# Patient Record
Sex: Male | Born: 1940 | Race: White | Hispanic: No | Smoking: Former smoker
Health system: Southern US, Community
[De-identification: ages and names within clinical notes are randomized; demographics above are authoritative.]

## PROBLEM LIST (undated history)

## (undated) DIAGNOSIS — B059 Measles without complication: Secondary | ICD-10-CM

## (undated) DIAGNOSIS — I739 Peripheral vascular disease, unspecified: Secondary | ICD-10-CM

## (undated) DIAGNOSIS — I639 Cerebral infarction, unspecified: Secondary | ICD-10-CM

## (undated) DIAGNOSIS — R7881 Bacteremia: Secondary | ICD-10-CM

## (undated) DIAGNOSIS — Z87440 Personal history of urinary (tract) infections: Secondary | ICD-10-CM

## (undated) DIAGNOSIS — N400 Enlarged prostate without lower urinary tract symptoms: Secondary | ICD-10-CM

## (undated) DIAGNOSIS — F0390 Unspecified dementia without behavioral disturbance: Secondary | ICD-10-CM

## (undated) DIAGNOSIS — R1310 Dysphagia, unspecified: Secondary | ICD-10-CM

## (undated) DIAGNOSIS — J449 Chronic obstructive pulmonary disease, unspecified: Secondary | ICD-10-CM

## (undated) DIAGNOSIS — D72820 Lymphocytosis (symptomatic): Secondary | ICD-10-CM

## (undated) DIAGNOSIS — I10 Essential (primary) hypertension: Secondary | ICD-10-CM

## (undated) DIAGNOSIS — B269 Mumps without complication: Secondary | ICD-10-CM

## (undated) DIAGNOSIS — E669 Obesity, unspecified: Secondary | ICD-10-CM

## (undated) DIAGNOSIS — E785 Hyperlipidemia, unspecified: Secondary | ICD-10-CM

## (undated) DIAGNOSIS — F419 Anxiety disorder, unspecified: Secondary | ICD-10-CM

## (undated) DIAGNOSIS — F633 Trichotillomania: Secondary | ICD-10-CM

## (undated) DIAGNOSIS — B019 Varicella without complication: Secondary | ICD-10-CM

## (undated) HISTORY — DX: Peripheral vascular disease, unspecified: I73.9

## (undated) HISTORY — DX: Cerebral infarction, unspecified: I63.9

## (undated) HISTORY — DX: Mumps without complication: B26.9

## (undated) HISTORY — DX: Chronic obstructive pulmonary disease, unspecified: J44.9

## (undated) HISTORY — PX: OTHER SURGICAL HISTORY: SHX169

## (undated) HISTORY — DX: Measles without complication: B05.9

## (undated) HISTORY — DX: Varicella without complication: B01.9

## (undated) HISTORY — DX: Unspecified dementia, unspecified severity, without behavioral disturbance, psychotic disturbance, mood disturbance, and anxiety: F03.90

## (undated) HISTORY — DX: Essential (primary) hypertension: I10

## (undated) HISTORY — DX: Bacteremia: R78.81

## (undated) HISTORY — DX: Hyperlipidemia, unspecified: E78.5

## (undated) HISTORY — DX: Benign prostatic hyperplasia without lower urinary tract symptoms: N40.0

## (undated) HISTORY — DX: Personal history of urinary (tract) infections: Z87.440

## (undated) HISTORY — DX: Obesity, unspecified: E66.9

---
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
## 1977-09-12 HISTORY — PX: BACK SURGERY: SHX140

## 1994-09-12 DIAGNOSIS — I639 Cerebral infarction, unspecified: Secondary | ICD-10-CM

## 1994-09-12 HISTORY — DX: Cerebral infarction, unspecified: I63.9

## 1997-09-12 HISTORY — PX: CARPAL TUNNEL RELEASE: SHX101

## 2000-08-05 ENCOUNTER — Encounter: Payer: Self-pay | Admitting: Cardiology

## 2000-08-05 ENCOUNTER — Encounter (INDEPENDENT_AMBULATORY_CARE_PROVIDER_SITE_OTHER): Payer: Self-pay | Admitting: *Deleted

## 2000-08-05 ENCOUNTER — Inpatient Hospital Stay (HOSPITAL_COMMUNITY): Admission: EM | Admit: 2000-08-05 | Discharge: 2000-08-07 | Payer: Self-pay | Admitting: Emergency Medicine

## 2000-08-07 ENCOUNTER — Encounter: Payer: Self-pay | Admitting: Cardiology

## 2000-12-05 ENCOUNTER — Encounter: Payer: Self-pay | Admitting: Emergency Medicine

## 2000-12-06 ENCOUNTER — Inpatient Hospital Stay (HOSPITAL_COMMUNITY): Admission: EM | Admit: 2000-12-06 | Discharge: 2000-12-07 | Payer: Self-pay | Admitting: Emergency Medicine

## 2000-12-06 ENCOUNTER — Encounter: Payer: Self-pay | Admitting: Neurology

## 2002-03-04 ENCOUNTER — Encounter: Payer: Self-pay | Admitting: Family Medicine

## 2002-03-04 ENCOUNTER — Encounter: Admission: RE | Admit: 2002-03-04 | Discharge: 2002-03-04 | Payer: Self-pay | Admitting: Family Medicine

## 2002-03-04 ENCOUNTER — Encounter: Payer: Self-pay | Admitting: Orthopedic Surgery

## 2002-03-14 ENCOUNTER — Ambulatory Visit (HOSPITAL_BASED_OUTPATIENT_CLINIC_OR_DEPARTMENT_OTHER): Admission: RE | Admit: 2002-03-14 | Discharge: 2002-03-15 | Payer: Self-pay | Admitting: Orthopedic Surgery

## 2002-08-15 ENCOUNTER — Ambulatory Visit (HOSPITAL_BASED_OUTPATIENT_CLINIC_OR_DEPARTMENT_OTHER): Admission: RE | Admit: 2002-08-15 | Discharge: 2002-08-15 | Payer: Self-pay | Admitting: Orthopedic Surgery

## 2003-09-13 HISTORY — PX: HERNIA REPAIR: SHX51

## 2003-12-31 ENCOUNTER — Encounter: Admission: RE | Admit: 2003-12-31 | Discharge: 2003-12-31 | Payer: Self-pay | Admitting: Family Medicine

## 2004-01-12 ENCOUNTER — Encounter: Admission: RE | Admit: 2004-01-12 | Discharge: 2004-01-12 | Payer: Self-pay | Admitting: Family Medicine

## 2004-05-28 ENCOUNTER — Ambulatory Visit (HOSPITAL_COMMUNITY): Admission: RE | Admit: 2004-05-28 | Discharge: 2004-05-28 | Payer: Self-pay | Admitting: Orthopedic Surgery

## 2004-06-23 ENCOUNTER — Ambulatory Visit (HOSPITAL_COMMUNITY): Admission: RE | Admit: 2004-06-23 | Discharge: 2004-06-24 | Payer: Self-pay | Admitting: Neurological Surgery

## 2004-07-12 ENCOUNTER — Encounter: Admission: RE | Admit: 2004-07-12 | Discharge: 2004-07-12 | Payer: Self-pay | Admitting: General Surgery

## 2004-08-19 ENCOUNTER — Inpatient Hospital Stay (HOSPITAL_COMMUNITY): Admission: RE | Admit: 2004-08-19 | Discharge: 2004-08-24 | Payer: Self-pay | Admitting: General Surgery

## 2005-01-17 ENCOUNTER — Encounter: Admission: RE | Admit: 2005-01-17 | Discharge: 2005-01-17 | Payer: Self-pay | Admitting: General Surgery

## 2005-03-11 ENCOUNTER — Ambulatory Visit (HOSPITAL_COMMUNITY): Admission: RE | Admit: 2005-03-11 | Discharge: 2005-03-11 | Payer: Self-pay | Admitting: Orthopedic Surgery

## 2005-06-26 ENCOUNTER — Emergency Department (HOSPITAL_COMMUNITY): Admission: EM | Admit: 2005-06-26 | Discharge: 2005-06-26 | Payer: Self-pay | Admitting: Emergency Medicine

## 2006-10-13 LAB — HM DIABETES EYE EXAM

## 2006-10-13 LAB — HM COLONOSCOPY

## 2007-05-30 ENCOUNTER — Ambulatory Visit: Payer: Self-pay | Admitting: Cardiology

## 2009-04-03 ENCOUNTER — Ambulatory Visit (HOSPITAL_COMMUNITY)
Admission: RE | Admit: 2009-04-03 | Discharge: 2009-04-03 | Payer: Self-pay | Admitting: Physical Medicine and Rehabilitation

## 2009-05-09 DIAGNOSIS — I10 Essential (primary) hypertension: Secondary | ICD-10-CM | POA: Insufficient documentation

## 2009-05-09 DIAGNOSIS — E782 Mixed hyperlipidemia: Secondary | ICD-10-CM

## 2009-05-19 ENCOUNTER — Ambulatory Visit: Payer: Self-pay | Admitting: Cardiology

## 2009-05-19 DIAGNOSIS — I739 Peripheral vascular disease, unspecified: Secondary | ICD-10-CM

## 2009-05-19 DIAGNOSIS — E669 Obesity, unspecified: Secondary | ICD-10-CM | POA: Insufficient documentation

## 2009-05-28 ENCOUNTER — Telehealth (INDEPENDENT_AMBULATORY_CARE_PROVIDER_SITE_OTHER): Payer: Self-pay

## 2009-06-01 ENCOUNTER — Ambulatory Visit: Payer: Self-pay

## 2009-06-01 ENCOUNTER — Encounter: Payer: Self-pay | Admitting: Cardiology

## 2009-06-09 ENCOUNTER — Telehealth: Payer: Self-pay | Admitting: Cardiology

## 2009-07-02 ENCOUNTER — Ambulatory Visit (HOSPITAL_COMMUNITY): Admission: RE | Admit: 2009-07-02 | Discharge: 2009-07-02 | Payer: Self-pay | Admitting: Orthopedic Surgery

## 2009-10-23 ENCOUNTER — Telehealth (INDEPENDENT_AMBULATORY_CARE_PROVIDER_SITE_OTHER): Payer: Self-pay | Admitting: *Deleted

## 2009-10-28 ENCOUNTER — Ambulatory Visit: Admission: RE | Admit: 2009-10-28 | Discharge: 2009-10-28 | Payer: Self-pay | Admitting: Orthopedic Surgery

## 2010-04-02 ENCOUNTER — Encounter: Admission: RE | Admit: 2010-04-02 | Discharge: 2010-04-02 | Payer: Self-pay | Admitting: Neurology

## 2010-05-24 ENCOUNTER — Encounter: Admission: RE | Admit: 2010-05-24 | Discharge: 2010-05-24 | Payer: Self-pay | Admitting: Neurology

## 2010-10-03 ENCOUNTER — Encounter: Payer: Self-pay | Admitting: Family Medicine

## 2010-10-04 ENCOUNTER — Encounter: Payer: Self-pay | Admitting: Physical Medicine and Rehabilitation

## 2010-10-14 NOTE — Progress Notes (Signed)
  Phone Note From Other Clinic   Caller: jenny/SS Details for Reason: Pt.information Initial call taken by: Deitra Mayo over to [phone] [facility]  October 23, 2009 12:43 PM

## 2010-12-01 LAB — BASIC METABOLIC PANEL
BUN: 11 mg/dL (ref 6–23)
CO2: 27 mEq/L (ref 19–32)
Calcium: 9.6 mg/dL (ref 8.4–10.5)
Chloride: 98 mEq/L (ref 96–112)
Creatinine, Ser: 0.98 mg/dL (ref 0.4–1.5)
GFR calc Af Amer: >60 mL/min (ref >60)
GFR calc non Af Amer: >60 mL/min (ref >60)
Glucose, Bld: 107 mg/dL — ABNORMAL HIGH (ref 70–99)
Potassium: 4.4 mEq/L (ref 3.5–5.1)
Sodium: 133 mEq/L — ABNORMAL LOW (ref 135–145)

## 2010-12-01 LAB — CBC
MCHC: 33.8 g/dL (ref 30.0–36.0)
MCV: 99.2 fL (ref 78.0–100.0)
Platelets: 354 10*3/uL (ref 150–400)
RBC: 4.78 MIL/uL (ref 4.22–5.81)
WBC: 10.1 10*3/uL (ref 4.0–10.5)

## 2010-12-01 LAB — ABO/RH: ABO/RH(D): A POS

## 2010-12-01 LAB — TYPE AND SCREEN
ABO/RH(D): A POS
Antibody Screen: NEGATIVE

## 2010-12-15 ENCOUNTER — Encounter: Payer: Self-pay | Admitting: Family Medicine

## 2010-12-15 DIAGNOSIS — I739 Peripheral vascular disease, unspecified: Secondary | ICD-10-CM | POA: Insufficient documentation

## 2010-12-15 DIAGNOSIS — J449 Chronic obstructive pulmonary disease, unspecified: Secondary | ICD-10-CM

## 2010-12-16 LAB — BASIC METABOLIC PANEL
BUN: 7 mg/dL (ref 6–23)
Calcium: 9.3 mg/dL (ref 8.4–10.5)
GFR calc non Af Amer: >60 mL/min (ref >60)
Glucose, Bld: 104 mg/dL — ABNORMAL HIGH (ref 70–99)
Potassium: 4.5 mEq/L (ref 3.5–5.1)

## 2010-12-16 LAB — CBC
HCT: 47.3 % (ref 39.0–52.0)
Platelets: 360 10*3/uL (ref 150–400)
RDW: 14.5 % (ref 11.5–15.5)
WBC: 11.1 10*3/uL — ABNORMAL HIGH (ref 4.0–10.5)

## 2010-12-19 LAB — CBC
HCT: 47.6 % (ref 39.0–52.0)
Hemoglobin: 16 g/dL (ref 13.0–17.0)
MCV: 99.4 fL (ref 78.0–100.0)
Platelets: 342 10*3/uL (ref 150–400)
RBC: 4.78 MIL/uL (ref 4.22–5.81)
WBC: 10.4 10*3/uL (ref 4.0–10.5)

## 2010-12-19 LAB — BASIC METABOLIC PANEL
Chloride: 103 mEq/L (ref 96–112)
GFR calc Af Amer: >60 mL/min (ref >60)
GFR calc non Af Amer: >60 mL/min (ref >60)
Potassium: 4.5 mEq/L (ref 3.5–5.1)
Sodium: 138 mEq/L (ref 135–145)

## 2010-12-20 ENCOUNTER — Encounter: Payer: Self-pay | Admitting: Family Medicine
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
## 2011-01-28 NOTE — Discharge Summary (Signed)
NAMETRUITT, CRUEY NO.:  [id number]   MEDICAL RECORD NO.:  [id number]          PATIENT TYPE:  INP   LOCATION:  0449                         FACILITY:  [facility]   PHYSICIAN:  Ollen Gross. Vernell Morgans, M.D. DATE OF BIRTH:  December 08, 1940   DATE OF ADMISSION:  08/19/2004  DATE OF DISCHARGE:  08/24/2004                                 DISCHARGE SUMMARY   Mr. Jonathan Wells is a 70 year old white male who had a ventral hernia.  He was  brought to the operating room on December 8 for a laparoscopic ventral  hernia repair with mesh.  His operation went very well.  Postoperatively, he  was having significant problems with pain control and on postoperative day  #1, had to be started on a PCA pump.  It was difficult for him to move  around because of his pain level.  He was continued with his PCA pump for a  couple of days and his diet was advanced.  On postoperative day #3, he was  noted to have some lower extremities weakness and there was some question as  to whether he could have developed a DVT.  He was started on Lovenox.  Duplex was ordered which was not done until December 12 but the Duplex did  not show any evidence of DVT.  At that point, his Lovenox was stopped.  He  was doing better on oral pain control medicines and on December 13 he was  ready for discharge home.   DISCHARGE MEDICATIONS:  He was to resume his home medications.  He was given  a prescription for Vicodin for pain.   DISCHARGE ACTIVITIES:  No heavy lifting.   FINAL DIAGNOSIS:  Ventral hernia.   CONDITION ON DISCHARGE:  Stable.   DIET:  As tolerated.   FOLLOW UP:  Will be with Dr. Carolynne Edouard in two weeks and he is discharged home.      PST/MEDQ  D:  09/28/2004  T:  09/28/2004  Job:  [id number]

## 2011-01-28 NOTE — Op Note (Signed)
Jonathan Wells, Jonathan Wells NO.:  [id number]   MEDICAL RECORD NO.:  [id number]          PATIENT TYPE:  INP   LOCATION:  0449                         FACILITY:  [facility]   PHYSICIAN:  Ollen Gross. Vernell Morgans, M.D. DATE OF BIRTH:  04/28/1941   DATE OF PROCEDURE:  08/19/2004  DATE OF DISCHARGE:                                 OPERATIVE REPORT   PREOPERATIVE DIAGNOSES:  Ventral hernia.   POSTOPERATIVE DIAGNOSES:  Ventral hernia.   OPERATION PERFORMED:  Laparoscopic ventral hernia repair with mesh.   SURGEON:  Ollen Gross. Carolynne Edouard, M.D.   ASSISTANT:  Leonie Man, M.D. t   ANESTHESIA:  General endotracheal.   DESCRIPTION OF PROCEDURE:  After informed consent was obtained, the patient  was brought to the operating room and placed in supine position on the  operating table.  After adequate induction of general anesthesia, the  patient's abdomen was prepped with Betadine and draped in the usual sterile  manner including an Ioban drape.  Initially, a small incision was made in  the right upper quadrant with a 15 blade knife and OptiView type port was  then used with a camera to bluntly dissect through all of the abdominal wall  layers bluntly until access was gained to the abdominal cavity.  The abdomen  was then insufflated with carbon dioxide without difficulty.  The  laparoscope was placed through the operative port and the abdominal wall was  examined.  There were a lot of adhesions along the midline.  These were  taken down by a combination of sharp dissection with the Harmonics scalpel  and blunt traction.  This was done through another 5 mm port in the right  lateral abdominal wall.  This port was made with a stab incision with a 15  blade knife and then a 5 mm port was placed bluntly through this incision  into the abdominal cavity under direct vision.  This was very tedious work  and took some time.  Once all of these adhesions were taken down from the  anterior abdominal wall  and care was taken to make sure that the bowel was  away from all of this dissection, then the abdominal wall was examined.  It  had several smaller hernia type defects in the fascial layer.  Because of  this, a very large piece of mesh was used that would cover the entire area.  The size of the defects was estimated using a spinal needle and a large  piece of Proseed mesh was used and cut to the appropriate size. The mesh was  oriented with the blue side up and marked to correspond to markings on the  abdominal wall for orientation.  #1 Novofils were then placed in eight  equidistant areas around the edge of the mesh.  The mesh was then rolled  like a cigar and placed through the OptiView port into the abdominal cavity  without difficulty.  The mesh was then unrolled and oriented appropriately  to the markings that were made.  Small stab incisions were made in eight  places around  the hernia defect corresponding to the appropriate Novofil  stitch and a suture passer device was used to puncture through the abdominal  wall and grab each of the tails of the appropriate Novofil stitch and bring  them through the abdominal wall.  As this was done each of the pair of tails  of the Novofil stitches was grasped with hemostats.  Once this had been done  in all eight places, the mesh was pulled up by the Novofil stitches to  approximately to the abdominal wall.  The mesh was examined and nicely  approximated to the abdominal wall without any folds or wrinkles.  Each of  the Novofils was then tied down. The areas in between the Novofil stitches  was approximated to the abdominal wall using tacks so that there were no  gaps in the mesh.  Once this was accomplished, the entire area was examined  and found to be hemostatic.  The ports were then removed under direct  vision.  The gas was allowed to escape and the mesh was observed to continue  to nicely approximate to the abdominal wall as the gas was  released.  The  incisions then were all closed with interrupted 4-0 Monocryl subcuticular  stitches.  Benzoin and Steri-Strips and sterile dressings were applied.  The  patient tolerated the procedure well.  At the end of the case all sponge,  needle and instrument counts were correct.  The patient was then awakened  and taken to the recovery room in stable condition.     [name]   PST/MEDQ  D:  08/23/2004  T:  08/23/2004  Job:  [id number]

## 2011-01-28 NOTE — H&P (Signed)
[place]  Patient:    Jonathan Wells                           MRN: [id number] Adm. Date:  [id number] Attending:  Fenton Malling                         History and Physical  DATE OF BIRTH:  [birth year].  CHIEF COMPLAINT:  Left-sided numbness and possible weakness.  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 The symptoms have improved somewhat since his arrival.  He states that the arm suddenly became tingly and felt numb.  He claims that it was weak but there is no objective evidence that it actually was.  He denies any involvement of the left leg or face, any changes in his vision or speech or any symptoms on the right.  He says that these symptoms were similar to those which preceded his prior stroke in 1992.  He denies any associated chest pain or palpitations. He has had some shortness of breath but ascribes this to a recent upper respiratory tract infection which has been going on for a few days.  PAST MEDICAL HISTORY:  Past medical history is remarkable for hypertension and hyperlipidemia, both of which are reportedly well-controlled.  He reportedly had a previous stroke resulting in left hemiparesis in 1992.  He saw ______ in [facility] for this.  He also reports that the workup revealed a cerebral aneurysm.  He says that this ruptured but on further questioning, it is clear that he did not undergo any kind of surgery.  He was admitted in November of last year for abdominal pain likely related to esophagitis.  FAMILY MEDICAL HISTORY:  Remarkable for hypertension and diabetes.  No family history of  stroke.  SOCIAL HISTORY:  He lives in [place] and is unemployed.  He has not smoked in about four years.  He occasionally consumes alcohol.  ALLERGIES:  No known drug allergies.  MEDICATIONS: 1. Baby aspirin q.d. 2. Plavix 75 mg q.d. 3. Zocor unknown dose q.d. 4. Accupril 20 mg q.d.  REVIEW OF SYSTEMS:  CONSTITUTIONAL:  No fever or chills.  HEAD:  Mild headache.  EYES:  Negative.  ENT:  URI symptoms x 5 days.  RESPIRATORY: Shortness of breath and cough.  CV:  No chest pain or palpitations.  GI: Constipation x 4 days.  GU:  Negative.  HEMATOLOGIC:  Negative.  PSYCHIATRIC: Negative.  PHYSICAL EXAMINATION:  VITAL SIGNS:  Temperature 98.4, blood pressure 128/71, pulse 80, respirations 24.  GENERAL/MENTAL STATUS:  He is awake, alert, fully oriented and in no evident distress.  His speech is normal.  HEAD:  Cranium is normocephalic and atraumatic.  Oropharynx is benign.  NECK:  Supple without bruit.  CHEST:  Expiratory wheezes.  HEART:  Regular rate and rhythm, without murmurs.  ABDOMEN:  Obese, soft and nondistended.  EXTREMITIES:  Without edema.  NEUROLOGIC:  Mental status as above.  Cranial nerves:  Funduscopic exam is benign.  Pupils are equal and briskly reactive.  The extraocular movements are normal without nystagmus.  Visual fields  are full to confrontation.  Face, tongue and palate all move normally.  Motor exam:  Normal bulk and tone.  No atrophy or fasciculations.  Normal strength in all tested extremity muscles. Sensation:  Decreased pinprick in the fingertips of the left hand.  Light touch and vibration are intact.  Cerebellar:  Rapid alternating movements are normal.  Finger-to-nose is performed well.  Gait is normal and he is able to heel, toe and tandem walk.  Reflexes are trace throughout.  Toes are downgoing.  LABORATORY DATA:  CBC:  WBC 14.4, hemoglobin 14.5, platelets 419,000. Coagulations are normal.  Electrolytes are pending.  EKG reveals  occasional PVCs but no acute findings.  CT of the head reveals no acute findings, with a questionable aneurysm in the area of the right MCA trifurcation.  IMPRESSION: 1. Right brain transient ischemic attack. 2. 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  PLAN: 1. MRI/MRA. 2. Carotid Dopplers. 3. Echocardiogram. 4. Continue aspirin and Plavix for now. 5. Disposition pending above. DD:  12/06/00 TD:  12/06/00 Job: [id number]

## 2011-01-28 NOTE — Op Note (Signed)
[place]. [facility]  Patient:    Jonathan Wells, Jonathan Wells Visit Number: [id number] MRN: [id number]          Service Type: DSU Location: [facility] Attending Physician:  Cornell Barman Dictated by:   Lenard Galloway Chaney Malling, M.D. Proc. Date: 03/14/02 Admit Date:  03/14/2002 Discharge Date: 03/14/2002                             Operative Report  PREOPERATIVE DIAGNOSIS:  Rotator cuff tear, right shoulder.  POSTOPERATIVE DIAGNOSIS:  Retractor tear supraspinatus, right shoulder.  OPERATION: 1. Diagnostic arthroscopy 2. Neer anterior one-third acromioplasty and open repair retracted    supraspinatus tendon using two Mitek sutures.  SURGEON:  Lenard Galloway. Chaney Malling, M.D.  ANESTHESIA:  General.  DESCRIPTION OF THE PROCEDURE:  After satisfactory endotracheal anesthesia the patient was placed on the operating table in a semi-seated position. The right shoulder and upper extremity was prepped with Duraprep and draped out in the usual manner. Through the posterior standard portal, the scope was introduced into the glenohumeral joint.  The articular cartilage of the humeral head and the glenoid was absolutely normal, as was the entire circumference of the labrum. The biceps attached to the labral area superiorly and there no tears and no abnormalities. There was fraying of the supraspinatus tendon which could clearly be seen. There was a hole in the supraspinatus as it exited into the subacromial space. A subacromial view was then obtained. There was a retracted tear of the supraspinatus and a probe could be placed easily through this tear.  A saber cut incision was made over the anterior aspect of the right shoulder. The skin edges were retracted. The bleeders were coagulated. The deltoid fibers were released on the anterior aspect of the acromion only. A very generous Neer anterior one-third acromioplasty was then done. This gave an excellent access to the  shoulder.  The subdeltoid bursa was excised. There was a very large tear and this was easily seen. The edges could be brought back down to an anatomic position. A rongeur was used to debride the bone so as good bleeding bone in the area of the repair. Two Mitek sutures were inserted into the bone with their anchors and suture brought through the rotator cuff and it was pulled down to an anatomic position and a water tight repair was achieved. Excellent stability of the repair was achieved. Throughout the procedure the shoulder was irrigated with copious amounts of antibiotic solution.  The deltoid fibers were then reattached using #1 Vicryl. Then 2-0 Vicryl was used to close the subcutaneous tissue and standard sterile tape was used to close the skin. Marcaine was placed in the shoulder and sterile dressings were applied.  The patient was then returned to the recovery room in excellent condition. He tolerated the procedure extremely well. There were no complications. No drains were placed. Dictated by:   Lenard Galloway Chaney Malling, M.D. Attending Physician:  Cornell Barman DD:  03/14/02 TD:  03/18/02 Job: [id number]

## 2011-01-28 NOTE — Op Note (Signed)
NAMEELRIC, TIRADO                              ACCOUNT NO.:  [id number]   MEDICAL RECORD NO.:  [id number]                   PATIENT TYPE:  AMB   LOCATION:  DSC                                  FACILITY:  [facility]   PHYSICIAN:  Rodney A. Chaney Malling, M.D.           DATE OF BIRTH:  17-Dec-1940   DATE OF PROCEDURE:  08/15/2002  DATE OF DISCHARGE:                                 OPERATIVE REPORT   PREOPERATIVE DIAGNOSIS:  Tear to medial meniscus, left knee.   POSTOPERATIVE DIAGNOSIS:  Tear to medial meniscus, left knee.   PROCEDURE:  Subtotal medial meniscectomy for large bucket handle tear,  medial meniscus of left knee.   SURGEON:  Lenard Galloway. Chaney Malling, M.D.   ANESTHESIA:  MAC.   DRAINS:  None.   COMPLICATIONS:  None.   PATHOLOGY:  With the arthroscope in the knee, a very careful examination of  both compartments was undertaken.  The patellofemoral joint appeared fairly  normal.  There was normal articular cartilage in the femoral notch area.  The arthroscope was passed into the lateral compartment, and the articular  cartilage of the lateral femoral condyle, the lateral tibial plateau, and  the _______ of the lateral meniscus was normal.  The arthroscope was then  passed into the intercondylar notch.  The anterior cruciate ligament was  essentially normal.  In the medial compartment, the articular cartilage of  the medial femoral condyle appeared fairly normal.  There was some slight  scuffing of the articular cartilage of the tibial plateau.  However, there  was a very large bucket handle tear of the medial meniscus, which was  detached posteriorly and left attached anteriorly.  The posterior two-thirds  of the medial meniscus was detached.   DESCRIPTION OF PROCEDURE:  The patient was placed on the operating table in  a supine position with the pneumatic tourniquet about the left upper thigh.  The left leg was placed in a leg holder and the entire left lower extremity  prepped  with Duraprep and draped out in the usual manner.  Marcaine had been  placed in the knee and Xylocaine and epinephrine used to infiltrate the  puncture wounds.  An infusion cannula was placed in the superior medial  pouch and the knee distended with saline.  The only pathology seen in the  medial meniscus was a very large bucket handle tear of the medial meniscus,  and this was still attached anteriorly.  Through the lateral portal, a  triangular retrograde knife was inserted and the anterior attachment of the  bucket handle tear was incised.  A pituitary was then inserted there, and  this large flap of meniscus was completely removed as a large entity.  A  series of baskets was then inserted, and remaining remnant was then  debrided.  The intra-articular shaver was introduced with smoothing of this  rim with nice transition of the anterior one-third  of the medial meniscus,  which appeared fairly normal.  Posteriorly there were remnants, which was  folded back on itself up behind the medial femoral condyle.  This was pulled  down with a probe and debrided with baskets, followed by the intra-articular  shaver.  Complete decompression of the posterior two-thirds of the meniscus was  achieved very nicely.  Marcaine was then placed in the knee and a large  bulky compressive dressing applied.  The patient returned to the recovery  room in excellent condition.  Technically this procedure went extremely  well.                                               Rodney A. Chaney Malling, M.D.    RAM/MEDQ  D:  08/15/2002  T:  08/15/2002  Job:  [id number]

## 2011-01-28 NOTE — Op Note (Signed)
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   PROCEDURE:  Redo left L4-5 hemilaminotomy, medial facetectomy, and  foraminotomy followed by microdiskectomy at L4-5 on the left utilizing  microscopic dissection.   SURGEON:  Tia Alert, M.D.   ASSISTANT:  Donalee Citrin, M.D.   ANESTHESIA:  General endotracheal.   COMPLICATIONS:  None apparent.   INDICATIONS FOR PROCEDURE:  The patient is a 70 year old male who had  undergone a previous left L4-5 microdiskectomy by another physician.  He had  a return of his left L5 radicular pain and had an MRI which showed a large  recurrent disk herniation at L4-5 on the left.  I recommended a repeat  microdiskectomy at L4-5 on the left.  He understood the risks, benefits, and  alternatives and wished to proceed.   DESCRIPTION OF PROCEDURE:  The patient was taken to the operating room where  after induction of adequate general endotracheal anesthesia, he was rolled  in the prone position on the Wilson frame with all pressure points padded.  His lumbar region was prepped with Duraprep and then draped in the usual  sterile fashion.  5 mL of local anesthesia was injected and then a dorsal  midline incision was made and carried down to the lumbosacral fascia.  The  fascia was opened and the paraspinous musculature was taken down in a  subperiosteal fashion to expose the L4-5 interspace on the left side.  Intraoperative x-ray confirmed our level and then the previous scar was  removed from over  the dura and we were able to widen the laminotomy, the  medial facetectomy, and the foraminotomy with the Kerrison punch to expose  the normal dura.  The operating microscope was brought into the field and  the dura was retracted medially and the epidural fibrosis was dissected to  detether the nerve root and then it was retracted medially and the disk  space was incised with a 15 blade scalpel and a thorough intradiskal  diskectomy was performed with pituitary rongeurs.  Once the diskectomy was  complete, we palpated with nerve hooks to assure no more compressive  lesions.  The nerve root was free and pulsatile.  We irrigated with saline  solution containing bacitracin and then closed the fascia with interrupted  #1 Vicryl.  We closed the subcutaneous and subcuticular tissue  with 2-0 and 3-0 Vicryl.  We closed the skin with Dermabond.  The drapes  were removed and a sterile dressing was applied.  The patient was awakened  from general anesthesia and transported to the recovery room in stable  condition.  At the end of the procedure, all needle, sponge, and instrument  counts correct.       DSJ/MEDQ  D:  06/23/2004  T:  06/24/2004  Job:  [id number]

## 2011-01-28 NOTE — Discharge Summary (Signed)
[facility]  Patient:    Jonathan Wells, Jonathan Wells                           MRN: [id number] Adm. Date:  [id number] Disc. Date: [id number] Attending:  Mirian Wells Dictator:   Jonathan Wells, P.A. CC:         Jonathan Wells. Jonathan Wells, M.D.  Jonathan Wells. Jonathan Wells, M.D. Jonathan Wells  Jonathan Wells, M.D.   Discharge Summary  PROCEDURES:  1. An adenosine Cardiolite study was performed on August 07, 2000.  2. Esophagogastroduodenoscopy was performed on August 07, 2000.  HISTORY OF PRESENT ILLNESS: Mr. Jonathan Wells is a 70 year old male, without prior history of heart disease, but with multiple cardiac risk factors including peripheral vascular disease (status post aortobifemoral procedure in 1995), a history of TIA, hyperlipidemia, and hypertension, who presented with a one week history of right upper quadrant and epigastric discomfort.  He reported exacerbation of this discomfort with exertion and following meals. Additionally, he reported episodes of diaphoresis and exertional dyspnea.  He was referred from his primary care physician, Dr. Andrey Wells, to the emergency room and was admitted for rule out of MI and further diagnostic evaluation.  LABORATORY DATA: Cardiac enzymes were obtained and CPK-MB was negative x 4. Troponin I was less than 0.01 x 2.  Amylase and lipase were normal.  A mildly decreased albumin was noted at 3.2 with marginally elevated AST of 40; otherwise, normal LFTs.  Sodium was 131, potassium 4.5, glucose 97, BUN 14, creatinine 0.8 on admission.  INR was 0.8.  WBC was 9.9, hemoglobin 14.9, hematocrit 40.4, platelets 446,000 on admission.  Abdominal ultrasound showed an unremarkable gallbladder.  Wells COURSE: The patient ruled out for MI with negative serial cardiac enzymes.  CT scans of the chest and abdomen revealed no evidence of aortic dissection. There was a question of focal pancreatitis of the head of the pancreas and this was reviewed by Dr. Daphine Wells.   Follow-up amylase and lipase levels were recommended and these were negative.  Cardiac work-up consisted of pharmacologic stress testing.  The patient did develop some hypotension during the procedure in the absence of any EKG abnormalities or chest discomfort.  He had systolic blood pressure lowering to the mid 80 range, which responded to saline boluses and infusion drip.  Blood pressure was stabilized and the patient proceeded to undergo esophagogastroduodenoscopy, which had been recommended by gastroenterology as part of their evaluation.  This study revealed evidence of esophagitis and duodenitis, with recommendation to treat with b.i.d. dosing of Protonix.  The Cardiolite scan revealed no significant abnormalities, with no evidence of redistribution, and question of inferior wall attenuation.  Calculated ejection fraction was 54%.  No further cardiac work-up was recommended.  DISCHARGE MEDICATIONS:  1. Protonix 40 mg b.i.d.  2. Accupril 20 mg q.d.  3. Zocor as previously directed.  4. Plavix 75 mg q.d.  5. Aspirin 81 mg q.d.  FOLLOW-UP: The patient is instructed to schedule a follow-up appointment with Jonathan Wells in the following one to two weeks.  DISCHARGE DIAGNOSES:  1. Esophagitis/duodenitis.     a. Esophagogastroduodenoscopy on August 07, 2000.  2. Multiple cardiac risk factors.     a. Normal adenosine Cardiolite study showing ejection fraction of 54% on        August 07, 2000.  3. Status post hypotension.  4. Peripheral vascular disease.     a. Status post aortobifemoral graft in 1995.  5.  History of transient ischemic attack. DD:  08/07/00 TD:  08/07/00 Job: [id number]

## 2011-03-24 ENCOUNTER — Inpatient Hospital Stay (HOSPITAL_COMMUNITY): Payer: Medicare Other

## 2011-03-24 ENCOUNTER — Inpatient Hospital Stay (HOSPITAL_COMMUNITY)
Admission: EM | Admit: 2011-03-24 | Discharge: 2011-03-25 | DRG: 093 | Disposition: A | Discharge status: Home / Self Care | Payer: Medicare Other | Attending: Internal Medicine | Admitting: Internal Medicine

## 2011-03-24 ENCOUNTER — Emergency Department (HOSPITAL_COMMUNITY): Payer: Medicare Other

## 2011-03-24 DIAGNOSIS — G4733 Obstructive sleep apnea (adult) (pediatric): Secondary | ICD-10-CM | POA: Diagnosis present

## 2011-03-24 DIAGNOSIS — E119 Type 2 diabetes mellitus without complications: Secondary | ICD-10-CM | POA: Diagnosis present

## 2011-03-24 DIAGNOSIS — E785 Hyperlipidemia, unspecified: Secondary | ICD-10-CM | POA: Diagnosis present

## 2011-03-24 DIAGNOSIS — R4789 Other speech disturbances: Secondary | ICD-10-CM | POA: Diagnosis present

## 2011-03-24 DIAGNOSIS — Z8673 Personal history of transient ischemic attack (TIA), and cerebral infarction without residual deficits: Secondary | ICD-10-CM

## 2011-03-24 DIAGNOSIS — R29810 Facial weakness: Principal | ICD-10-CM | POA: Diagnosis present

## 2011-03-24 DIAGNOSIS — E669 Obesity, unspecified: Secondary | ICD-10-CM | POA: Diagnosis present

## 2011-03-24 DIAGNOSIS — I739 Peripheral vascular disease, unspecified: Secondary | ICD-10-CM | POA: Diagnosis present

## 2011-03-24 DIAGNOSIS — I10 Essential (primary) hypertension: Secondary | ICD-10-CM | POA: Diagnosis present

## 2011-03-24 LAB — URINALYSIS, ROUTINE W REFLEX MICROSCOPIC
Bilirubin Urine: NEGATIVE
Glucose, UA: NEGATIVE mg/dL
Hgb urine dipstick: NEGATIVE
Specific Gravity, Urine: 1.01 (ref 1.005–1.030)

## 2011-03-24 LAB — COMPREHENSIVE METABOLIC PANEL
Albumin: 3.7 g/dL (ref 3.5–5.2)
Alkaline Phosphatase: 48 U/L (ref 39–117)
BUN: 7 mg/dL (ref 6–23)
CO2: 28 mEq/L (ref 19–32)
Chloride: 98 mEq/L (ref 96–112)
GFR calc non Af Amer: >60 mL/min (ref >60)
Potassium: 4.2 mEq/L (ref 3.5–5.1)
Total Bilirubin: 0.3 mg/dL (ref 0.3–1.2)

## 2011-03-24 LAB — CBC
HCT: 47.6 % (ref 39.0–52.0)
Hemoglobin: 16 g/dL (ref 13.0–17.0)
MCV: 95.4 fL (ref 78.0–100.0)
RBC: 4.99 MIL/uL (ref 4.22–5.81)
RDW: 15.3 % (ref 11.5–15.5)
WBC: 9.8 10*3/uL (ref 4.0–10.5)

## 2011-03-24 LAB — TROPONIN I: Troponin I: <0.3 ng/mL (ref <0.30)

## 2011-03-24 LAB — PROTIME-INR: Prothrombin Time: 12.4 seconds (ref 11.6–15.2)

## 2011-03-24 LAB — GLUCOSE, CAPILLARY: Glucose-Capillary: 137 mg/dL — ABNORMAL HIGH (ref 70–99)

## 2011-03-24 LAB — CK TOTAL AND CKMB (NOT AT ARMC)
CK, MB: 2 ng/mL (ref 0.3–4.0)
Total CK: 77 U/L (ref 7–232)

## 2011-03-24 NOTE — H&P (Signed)
Jonathan Wells, SKY NO.:  [id number]  MEDICAL RECORD NO.:  [id number]  LOCATION:  WLED                         FACILITY:  [facility]  PHYSICIAN:  Andreas Blower, MD       DATE OF BIRTH:  11-06-1940  DATE OF ADMISSION:  03/24/2011 DATE OF DISCHARGE:                             HISTORY & PHYSICAL   PRIMARY CARE PHYSICIAN:  Newman Nip, M.D.  CHIEF COMPLAINT:  Right facial droop and slurred speech.  HISTORY OF PRESENT ILLNESS:  Jonathan Wells is a 70 year old Caucasian male with history of TIA, hypertension, type 2 diabetes, peripheral vascular disease, questionable history of CVA in 1992, obesity, obstructive sleep apnea, who presents with the above complaints.  He reported that about a week ago on March 16, 2011, he noted a friend commented that his right side of his face was more droopy and had slurred speech.  Subsequently since then, the patient did not make much of it until his family noticed that his speech was more slurred and the patient had a right facial droop.  As a result, he presented to the ER for further evaluation.  The patient reports that other than right facial droop and slurred speech, he has not noticed any other symptoms.  He has not noticed any weakness in his hands or legs.  He has not noticed any change in his gait.  He has not had any recent fevers or chills.  Denies any nausea, vomiting. Denies any chest pain, shortness of breath.  Denies any abdominal pain, diarrhea, headaches, or vision changes.  Head CT in the ER did not show any acute changes.  As a result, the hospitalist service was asked to admit the patient for further workup of possible stroke.  REVIEW OF SYSTEMS:  All systems were reviewed with the patient was positive as per HPI, otherwise all other systems were negative.  PAST MEDICAL HISTORY: 1. Hypertension. 2. Hyperlipidemia. 3. Type 2 diabetes. 4. Obesity. 5. History of obstructive sleep apnea, does not use a CPAP as he    reports that he is claustrophobic. 6. History of TIA. 7. 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  This     diagnosis is questionable as he reports that it may have been due     to cerebral aneurysm. 8. History of peripheral vascular disease. 9. History of rotator cuff tear status post surgery in July 2003. 10.History of left knee meniscal tear status post surgery in December     2003. 11.History of recurrent lumbar disk herniation at L4-L5, status post     redo in October 2005. 12.History of ventral hernia repair in December 2005.  SOCIAL HISTORY:  The patient quit smoking in 97.  Drinks beer once or twice a week.  Denies any illegal drugs or substances.  FAMILY HISTORY:  Significant for diabetes.  Mother had COPD.  Father had cancer, possibly throat cancer.  HOME MEDICATIONS:  To be accurately reconciled by pharmacy.  The patient is on: 1. Lisinopril 5 mg p.o. daily 2. Metformin 1000 mg p.o. twice daily 3. Trilipix 135 mg p.o. daily. 4. Simvastatin 40 mg p.o. daily 5. Nortriptyline 25 mg  p.o. daily. 6. Vitamin D3 5000 mg IU p.o. daily.  PHYSICAL EXAMINATION:  VITAL SIGNS:  Temperature 97.6, blood pressure 122/72, heart rate 85, respiration 20, satting at 95% on room air. GENERAL:  The patient was alert, oriented, did not appear to be in acute distress.  He was laying in bed comfortably, was able to provide most of the history. HEENT:  Extraocular motions are intact.  Pupils equal, round.  Had moist mucous membranes. NECK:  Supple. HEART:  Regular with S1-S2. LUNGS:  Clear were auscultation bilaterally. ABDOMEN:  Soft, nontender, and nondistended.  Positive bowel sounds. EXTREMITIES:  The patient had good peripheral pulses.  Trace edema. NEURO:  Cranial nerves II-XII grossly intact.  Had 5/5 motor strength in upper as well as lower extremities.  Facial nerve was intact on the right and left, and was able to smile and close his eye lids with appropriate  strength bilaterally.  RADIOLOGY/IMAGING:  The patient had head CT without contrast, which shows no acute abnormality.  Mildly progressive mild chronic small vessel white matter ischemic changes in both cerebral hemispheres. Mildly progressive atrophy.  LABORATORY DATA:  CBC shows a white count of 9.8, hemoglobin 16.0, hematocrit 47.6, and platelet count 372.  Electrolytes, normal with a BUN of 70, creatinine 0.94.  Liver function tests normal except AST is 43.  UA is negative for nitrites and leukocytes.  ASSESSMENT/PLAN: 1. Right facial droop and slurred speech.  Given history of peripheral     vascular disease, hypertension, diabetes, and hyperlipidemia, the     patient is at risk for of possible left-sided cerebrovascular     accident affecting the right side of his face.  We will admit the     patient and do a stroke workup.  We will send for MRI under     conscious sedation as the patient reports that he is     claustrophobic.  We will also get a 2-D echocardiogram.  We will     start the patient on aspirin.  The patient is out of the     therapeutic window for TPA as he reported that his symptoms started     greater than a week ago.2. Hypertension, stable at this time.  Continue lisinopril with hold     parameters for systolic less than 110. 3. Hyperlipidemia.  Continue simvastatin and fenofibrate. 4. Type 2 diabetes.  We will hold metformin.  We will have the patient     on sliding scale insulin. 5. Obesity.  Encourage diet and exercise as an outpatient. 6. History of obstructive sleep apnea.  The patient reports     claustrophobia and intolerance to CPAP. 7. Peripheral vascular disease.  Continue management as an outpatient. 8. Prophylaxis.  Lovenox for deep vein thrombosis prophylaxis. 9. Code status.  The patient is full code.  Time spent on admission talking to the patient and coordinating care was 1 hour.   Andreas Blower, MD   SR/MEDQ  D:  03/24/2011  T:   03/24/2011  Job:  [id number]  Electronically Signed by Wardell Heath Audie Wieser  on 03/24/2011 08:49:08 PM

## 2011-03-25 ENCOUNTER — Ambulatory Visit (HOSPITAL_COMMUNITY): Admit: 2011-03-25 | Payer: Medicare Other

## 2011-03-25 ENCOUNTER — Ambulatory Visit (HOSPITAL_COMMUNITY)
Admit: 2011-03-25 | Discharge: 2011-03-25 | Disposition: A | Discharge status: Home / Self Care | Payer: Medicare Other | Attending: Internal Medicine | Admitting: Internal Medicine

## 2011-03-25 DIAGNOSIS — G459 Transient cerebral ischemic attack, unspecified: Secondary | ICD-10-CM

## 2011-03-25 LAB — LIPID PANEL
HDL: 47 mg/dL (ref >39)
Total CHOL/HDL Ratio: 2.7 RATIO
VLDL: 21 mg/dL (ref 0–40)

## 2011-03-25 LAB — GLUCOSE, CAPILLARY: Glucose-Capillary: 112 mg/dL — ABNORMAL HIGH (ref 70–99)

## 2011-03-25 MED ORDER — GADOBENATE DIMEGLUMINE 529 MG/ML IV SOLN: 20.0000 mL | Freq: Once | INTRAVENOUS | Status: AC | PRN

## 2011-03-26 NOTE — Discharge Summary (Signed)
NAMESIVAN, QUAST NO.:  [id number]  MEDICAL RECORD NO.:  [id number]  LOCATION:  MRI                          FACILITY:  [facility]  PHYSICIAN:  Andreas Blower, MD       DATE OF BIRTH:  May 06, 1941  DATE OF ADMISSION:  03/25/2011 DATE OF DISCHARGE:                              DISCHARGE SUMMARY   PRIMARY CARE PHYSICIAN:  Newman Nip, M.D.  DISCHARGE DIAGNOSES: 1. Right facial droop and slurred speech  Magnetic resonance imaging     negative for cerebrovascular accident. 2. History of transient ischemic attack. 3. Hypertension. 4. Hyperlipidemia. 5. Type 2 diabetes. 6. Obesity. 7. Obstructive sleep apnea.  Patient does not use CPAP as he reports     that he is claustrophobic. 8. Peripheral vascular disease. 9. Recent  history of cerebrovascular accident with left hemiparesis     in 1999, diagnosis questionable. 10.History of rotator cuff tear, status post surgery in July 2003. 11.History of left knee meniscal tear, status post surgery in December     2003. 12.History of recurrent lumbar disk herniation at L4-L5, status post     redo in October 2005. 13.History of ventral hernia repair in 2005.  DISCHARGE MEDICATIONS: 1. Aspirin 81 mg p.o. daily 2. Trilipix (fenofibrate) 160 mg p.o. daily. 3. Hydrocodone/APAP 5/500 one to two tablets every 6 hours as needed     for pain. 4. Lisinopril 5 mg p.o. daily. 5. Metformin 1000 mg p.o. twice daily. 6. Nortriptyline 25 mg p.o. daily. 7. Simvastatin 40 mg p.o. daily at bedtime. 8. Vitamin D3 5000 units p.o. daily.  BRIEF ADMITTING HISTORY AND PHYSICAL:  Mr. Hensen is a 70 year old Caucasian male with history of TIA, hypertension, type 2 diabetes, peripheral vascular disease, obesity, obstructive sleep apnea, who presents with right facial droop and slurred speech.  RADIOLOGY OR IMAGING:  Patient had a head CT without contrast, which shows no acute abnormality.  Mildly progressive mild chronic small- vessel  white matter ischemic changes in both cerebral hemispheres.  Mild progressive atrophy.  Patient had a MRI of the head with and without contrast, MRA head without contrast and MRA neck with and without contrast, which showed motion degraded exam.  No acute infarct.  Small vessel disease type changes.  Global atrophy.  Paranasal sinus mucosal thickening.  MRA of the head showed question small bulge along the superior margin of the distal M1 segment of the left MCA, tiny aneurysm not excluded.  MRA of the neck showed exam limited by motion, origin of the great vessels is markedly limited.  There may be apparent right subclavian artery.  Right carotid bifurcation with mild narrowing without hemodynamically significant stenosis.  Left carotid bifurcation with plaque and irregularity with maximal stenosis less than 60%.  Patient had a 2-D echocardiogram, which showed systolic function was normal.  Ejection fraction was 55%-60%.  Wall motion was normal.  There were no regional wall motion abnormalities.  Overall, poor image quality.  No defect or patent foramen ovale identified.  Patient had carotid Dopplers, which showed no ICA stenosis bilaterally.  LABORATORY DATA:  CBC shows a white count 9.8, hemoglobin 16.0, hematocrit 47.6, platelet count 372,000.  Electrolytes normal with a BUN of 7, creatinine 0.94.  Electrolytes normal except AST is 43, hemoglobin A1c 6.5.  LDL is 61, troponin-I less than 0.30.  UA negative for nitrites and leukocytes.  HOSPITAL COURSE BY PROBLEM: 1. Right facial droop and slurred speech:  Patient had an MRI, which     was negative for CVA.  Patient initially on admission even though     had a right facial droop, patient was able to move his facial     muscles symmetrically bilaterally.  Patient was evaluated by Speech     Therapy and no needs were identified.  Patient was able to ambulate     the halls and no PT and OT need were identified.  Given patient's      history of peripheral vascular disease and TIA, patient was started     on aspirin, which he will continue after discharge with baby     aspirin.  Bell's palsy was also considered; however, the patient     again has symmetrical movements of his face muscles bilaterally.     Uncertain if the patient's symptoms were due to TIA, and since     onset his symptoms seem to have resolved. 2. Hypertension, stable:  Continue the patient on medications. 3. Hyperlipidemia, stable:  Continue the patient on statin, well     controlled. 4. Type 2 diabetes:  Continue metformin, hemoglobin A1c 6.5 under good     control at this time. 5. Obesity:  Instructed the patient to diet and exercise.  He reports     that he has been trying to diet and has lost about 10 pounds     within the last 3 months. 6. Obstructive sleep apnea:  Reports, he has claustrophobia and     reports intolerance to CPAP.  DISPOSITION AND FOLLOWUP:  Patient will follow up with Dr. Tanya Nones and his primary care physician in 1 week.  Time spent on discharge, talking to the patient, coordinating care was 25 minutes.   Andreas Blower, MD   SR/MEDQ  D:  03/25/2011  T:  03/25/2011  Job:  [id number]  Electronically Signed by Wardell Heath Theordore Cisnero  on 03/26/2011 03:38:44 PM

## 2011-03-28 LAB — GLUCOSE, CAPILLARY: Glucose-Capillary: 139 mg/dL — ABNORMAL HIGH (ref 70–99)

## 2011-04-07 ENCOUNTER — Encounter: Payer: Self-pay | Admitting: Family Medicine

## 2012-12-03 ENCOUNTER — Encounter: Payer: Self-pay | Admitting: Family Medicine

## 2012-12-03 ENCOUNTER — Telehealth: Payer: Self-pay | Admitting: Family Medicine

## 2012-12-03 NOTE — Telephone Encounter (Signed)
Ok to refill 

## 2012-12-03 NOTE — Telephone Encounter (Signed)
PT SENT LETTER

## 2012-12-03 NOTE — Telephone Encounter (Signed)
[id number] [id number]

## 2012-12-14 ENCOUNTER — Encounter: Payer: Self-pay | Admitting: Family Medicine

## 2012-12-14 DIAGNOSIS — E669 Obesity, unspecified: Secondary | ICD-10-CM | POA: Insufficient documentation

## 2012-12-14 DIAGNOSIS — G8929 Other chronic pain: Secondary | ICD-10-CM | POA: Insufficient documentation

## 2012-12-14 DIAGNOSIS — N400 Enlarged prostate without lower urinary tract symptoms: Secondary | ICD-10-CM | POA: Insufficient documentation

## 2012-12-14 DIAGNOSIS — M549 Dorsalgia, unspecified: Secondary | ICD-10-CM

## 2012-12-17 ENCOUNTER — Ambulatory Visit (INDEPENDENT_AMBULATORY_CARE_PROVIDER_SITE_OTHER): Payer: Medicare Other | Admitting: Family Medicine

## 2012-12-17 ENCOUNTER — Encounter: Payer: Self-pay | Admitting: Family Medicine

## 2012-12-17 VITALS — BP 120/68 | HR 76 | Temp 97.6°F | Resp 16 | Wt 225.0 lb

## 2012-12-17 DIAGNOSIS — E1159 Type 2 diabetes mellitus with other circulatory complications: Secondary | ICD-10-CM

## 2012-12-17 DIAGNOSIS — I10 Essential (primary) hypertension: Secondary | ICD-10-CM

## 2012-12-17 DIAGNOSIS — E785 Hyperlipidemia, unspecified: Secondary | ICD-10-CM

## 2012-12-17 DIAGNOSIS — R918 Other nonspecific abnormal finding of lung field: Secondary | ICD-10-CM

## 2012-12-17 LAB — CBC WITH DIFFERENTIAL/PLATELET
Eosinophils Absolute: 0.2 10*3/uL (ref 0.0–0.7)
Eosinophils Relative: 2 % (ref 0–5)
Hemoglobin: 15.7 g/dL (ref 13.0–17.0)
Lymphs Abs: 4.6 10*3/uL — ABNORMAL HIGH (ref 0.7–4.0)
MCH: 31.7 pg (ref 26.0–34.0)
MCHC: 34.1 g/dL (ref 30.0–36.0)
MCV: 92.7 fL (ref 78.0–100.0)
Monocytes Absolute: 0.9 10*3/uL (ref 0.1–1.0)
Monocytes Relative: 9 % (ref 3–12)
RBC: 4.96 MIL/uL (ref 4.22–5.81)

## 2012-12-17 LAB — HEPATIC FUNCTION PANEL
ALT: 20 U/L (ref 0–53)
AST: 20 U/L (ref 0–37)
Albumin: 4.1 g/dL (ref 3.5–5.2)
Alkaline Phosphatase: 44 U/L (ref 39–117)
Total Protein: 6.8 g/dL (ref 6.0–8.3)

## 2012-12-17 LAB — HEMOGLOBIN A1C
Hgb A1c MFr Bld: 6.6 % — ABNORMAL HIGH (ref <5.7)
Mean Plasma Glucose: 143 mg/dL — ABNORMAL HIGH (ref <117)

## 2012-12-17 LAB — BASIC METABOLIC PANEL
CO2: 26 mEq/L (ref 19–32)
Chloride: 105 mEq/L (ref 96–112)
Potassium: 4.9 mEq/L (ref 3.5–5.3)

## 2012-12-17 LAB — LIPID PANEL: Total CHOL/HDL Ratio: 2.9 Ratio

## 2012-12-17 MED ORDER — HYDROCODONE-ACETAMINOPHEN 5-500 MG PO TABS: 1.0000 | ORAL_TABLET | Freq: Four times a day (QID) | ORAL | Status: DC | PRN

## 2012-12-17 NOTE — Progress Notes (Signed)
Subjective:     Patient ID: Jonathan Wells, male   DOB: [birth year], 72 y.o.   MRN: [id number]  HPI Patient is requesting a refill on his Vicodin which he takes for chronic back pain. He is on 5/500 by mouth 4 times a day. He getss 120 month.    He also reports tingling and itching in his scalp. This is been a chronic condition. Dermatology and I have tried numerous therapies unsuccessfully including topical steroid creams, scabies ointment, anti-itch medications.  I had a discussion with the patient in the past and I believe this is neurogenic in nature.  He denies currently being interested in using gabapentin.  Diabetes mellitus-he is currently on metformin. He denies polyuria, polydipsia or blurred vision.  He is not checking his sugars and his not following a low carb diet.  Hyperlipidemia he is currently on simvastatin 80 mg by mouth daily and low fiber 134 mg by mouth daily.  He denies myalgia or right upper quadrant pain.  Hypertension he is currently taking lisinopril 5 mg by mouth daily.  He denies chest pain shortness of breath or dyspnea on exertion..  he has a history of peripheral vascular disease.  Past Medical History  Diagnosis Date  . Stroke 1996  . Hyperlipidemia   . Hypertension   . COPD (chronic obstructive pulmonary disease)   . BPH (benign prostatic hypertrophy)   . PVD (peripheral vascular disease)   . Claudication   . Obesity   . Diabetes mellitus    Current Outpatient Prescriptions on File Prior to Visit  Medication Sig Dispense Refill  . Cholecalciferol (VITAMIN D) 2000 UNITS tablet 2,000 Units. 3 caps. Po qd        . lisinopril (PRINIVIL,ZESTRIL) 5 MG tablet Take 5 mg by mouth daily.        . metFORMIN (GLUCOPHAGE) 1000 MG tablet Take 1,000 mg by mouth 2 (two) times daily with a meal.        . nortriptyline (PAMELOR) 25 MG capsule Take 25 mg by mouth at bedtime.        . sildenafil (VIAGRA) 100 MG tablet Take 100 mg by mouth daily as needed.        .  simvastatin (ZOCOR) 80 MG tablet 80 mg. 1/2 tab po qhs         No current facility-administered medications on file prior to visit.   History   Social History  . Marital Status: Single    Spouse Name: N/A    Number of Children: N/A  . Years of Education: N/A   Occupational History  . Not on file.   Social History Main Topics  . Smoking status: Former Smoker    Quit date: 09/13/1995  . Smokeless tobacco: Not on file  . Alcohol Use: Yes  . Drug Use: No  . Sexually Active: Not on file   Other Topics Concern  . Not on file   Social History Narrative  . No narrative on file     Review of Systems    review of systems is otherwise negative Objective:   Physical Exam  Constitutional: He appears well-developed and well-nourished.  HENT:  Head: Normocephalic.  Right Ear: External ear normal.  Left Ear: External ear normal.  Mouth/Throat: Oropharynx is clear and moist.  Eyes: Conjunctivae are normal. Pupils are equal, round, and reactive to light.  Neck: Normal range of motion. Neck supple. No JVD present. No thyromegaly present.  Cardiovascular: Normal rate, regular rhythm and normal  heart sounds.   No murmur heard. Pulses:      Dorsalis pedis pulses are 1+ on the right side, and 1+ on the left side.       Posterior tibial pulses are 1+ on the right side, and 1+ on the left side.  Pulmonary/Chest: Effort normal. No respiratory distress. He has no wheezes. He has rales (RLL).  Abdominal: Soft. Bowel sounds are normal. There is no tenderness. There is no rebound and no guarding.  Lymphadenopathy:    He has no cervical adenopathy.   Significant or trace  pedal pulses bilaterally but normal sensation to 10 mg monofilament bilaterally.    Assessment:    1. Type II or unspecified type diabetes mellitus with peripheral circulatory disorders, uncontrolled(250.72) A1c is less than 6.5.  Recommended a low carbohydrate diet and regular aerobic exercise - Basic Metabolic  Panel - CBC with Differential - Hepatic Function Panel - Lipid Panel - Hemoglobin A1c  2. Essential hypertension, benign At goal same - Basic Metabolic Panel  3. Other and unspecified hyperlipidemia Goal LDL is less than 70 - Lipid Panel  4. Lung field abnormal finding on examination Patient never went for chest x-ray in September when similar findings were found on his exam. Strongly recommended he get chest x-ray to rule out neoplasia given his history of smoking - DG Chest 2 View; Future     Plan:     Refill the patient's Vicodin 120/month. See above

## 2012-12-20 ENCOUNTER — Ambulatory Visit
Admission: RE | Admit: 2012-12-20 | Discharge: 2012-12-20 | Disposition: A | Discharge status: Home / Self Care | Payer: Medicare Other | Source: Ambulatory Visit | Attending: Family Medicine | Admitting: Family Medicine

## 2012-12-20 DIAGNOSIS — R918 Other nonspecific abnormal finding of lung field: Secondary | ICD-10-CM

## 2013-01-15 ENCOUNTER — Other Ambulatory Visit: Payer: Self-pay | Admitting: Family Medicine

## 2013-01-15 ENCOUNTER — Encounter: Payer: Self-pay | Admitting: Family Medicine

## 2013-01-15 NOTE — Telephone Encounter (Signed)
This encounter was created in error - please disregard.

## 2013-01-15 NOTE — Telephone Encounter (Signed)
Ok to refill 

## 2013-01-15 NOTE — Telephone Encounter (Signed)
Last refill 12/17/12  Hydrocodone 5/325 q6h prn #120 Need approval for controlled medication.

## 2013-02-13 ENCOUNTER — Other Ambulatory Visit: Payer: Self-pay | Admitting: Family Medicine

## 2013-02-14 ENCOUNTER — Other Ambulatory Visit: Payer: Self-pay | Admitting: Family Medicine

## 2013-02-14 NOTE — Telephone Encounter (Signed)
Refill appropriate.  Pt has been seen recently.  Refilled called to pharm #[id number] + 0 RF.

## 2013-02-14 NOTE — Telephone Encounter (Signed)
Ok to refill 

## 2013-03-13 ENCOUNTER — Other Ambulatory Visit: Payer: Self-pay | Admitting: Family Medicine

## 2013-03-13 NOTE — Telephone Encounter (Signed)
?   OK to Refill  

## 2013-03-13 NOTE — Telephone Encounter (Signed)
Approved. #[id number]. 

## 2013-03-21 ENCOUNTER — Other Ambulatory Visit: Payer: Self-pay | Admitting: Family Medicine

## 2013-03-21 NOTE — Telephone Encounter (Signed)
Med refilled.

## 2013-04-01 ENCOUNTER — Ambulatory Visit (INDEPENDENT_AMBULATORY_CARE_PROVIDER_SITE_OTHER): Payer: Medicare Other | Admitting: Family Medicine

## 2013-04-01 ENCOUNTER — Encounter: Payer: Self-pay | Admitting: Family Medicine

## 2013-04-01 VITALS — BP 110/70 | HR 72 | Temp 98.2°F | Resp 16 | Wt 223.0 lb

## 2013-04-01 DIAGNOSIS — T7840XA Allergy, unspecified, initial encounter: Secondary | ICD-10-CM

## 2013-04-01 MED ORDER — METHYLPREDNISOLONE ACETATE 40 MG/ML IJ SUSP
80.0000 mg | Freq: Once | INTRAMUSCULAR | Status: AC
  Administered 2013-04-01: 80 mg via INTRAMUSCULAR

## 2013-04-01 NOTE — Progress Notes (Signed)
Subjective:    Patient ID: Jonathan Wells, male    DOB: March 08, 1941, 71 y.o.   MRN: [id number]  HPI Awoke Saturday morning with erythema, swelling, and edema around both eyes and both cheeks. There is no pain. His skin itches badly. He denies any fever or chills or signs of systemic illness. He denies any wheezing or stridor. He has no allergic contacts that he is aware of including ingestions or contacts.. Past Medical History  Diagnosis Date  . Stroke 1996  . Hyperlipidemia   . Hypertension   . COPD (chronic obstructive pulmonary disease)   . BPH (benign prostatic hypertrophy)   . PVD (peripheral vascular disease)   . Claudication   . Obesity   . Diabetes mellitus    Current Outpatient Prescriptions on File Prior to Visit  Medication Sig Dispense Refill  . Cholecalciferol (VITAMIN D) 2000 UNITS tablet 2,000 Units. 3 caps. Po qd        . fenofibrate micronized (LOFIBRA) 134 MG capsule TAKE ONE CAPSULE BY MOUTH EVERY DAY  30 capsule  5  . HYDROcodone-acetaminophen (NORCO/VICODIN) 5-325 MG per tablet TAKE 1 TABLET BY MOUTH EVERY 6 HOURS AS NEEDED FOR PAIN  120 tablet  0  . HYDROcodone-acetaminophen (VICODIN) 5-500 MG per tablet Take 1 tablet by mouth every 6 (six) hours as needed.  120 tablet  0  . lisinopril (PRINIVIL,ZESTRIL) 5 MG tablet TAKE 1 TABLET BY MOUTH EVERY DAY  30 tablet  1  . metFORMIN (GLUCOPHAGE) 1000 MG tablet Take 1,000 mg by mouth 2 (two) times daily with a meal.        . nortriptyline (PAMELOR) 25 MG capsule Take 25 mg by mouth at bedtime.        . sildenafil (VIAGRA) 100 MG tablet Take 100 mg by mouth daily as needed.        . simvastatin (ZOCOR) 80 MG tablet 80 mg. 1/2 tab po qhs         No current facility-administered medications on file prior to visit.   [facility] Allergies  Allergen Reactions  . Lipitor (Atorvastatin Calcium)     Leg pain      Review of Systems  All other systems reviewed and are negative.       Objective:   Physical Exam  Vitals  reviewed. Constitutional: He appears well-developed and well-nourished.  HENT:  Head: Head is with right periorbital erythema and with left periorbital erythema.  Mouth/Throat: Uvula is midline, oropharynx is clear and moist and mucous membranes are normal.  Neck: Neck supple.  Cardiovascular: Normal rate, regular rhythm and normal heart sounds.   No murmur heard. Pulmonary/Chest: Effort normal and breath sounds normal. No respiratory distress. He has no wheezes. He has no rales. He exhibits no tenderness.  Abdominal: Soft. Bowel sounds are normal.  Lymphadenopathy:    He has no cervical adenopathy.  Skin: Rash noted. There is erythema.   Patient had significant periorbital edema and erythema. It is warm to the touch but is nontender. He is well demarcated erythema from both eyebrows down below the nose and running from ear to ear.  There is no swelling of the lips or tongue.  The erythema looks urticarial rather than cellulitic       Assessment & Plan:  Most likely an allergic reaction with erysipelas less likely.  Begin Benadryl 25 mg every 6 hours. The patient was given Depo-Medrol 80 mg IM times one here. I will start him on Keflex 500 mg by mouth  3 times a day for 7 days just to cover for possible erysipelas. I asked him to recheck here in 24 hours or go to the emergency room if worsening. I also asked him to discontinue lisinopril.

## 2013-04-02 ENCOUNTER — Encounter: Payer: Self-pay | Admitting: Family Medicine

## 2013-04-02 ENCOUNTER — Ambulatory Visit (INDEPENDENT_AMBULATORY_CARE_PROVIDER_SITE_OTHER): Payer: Medicare Other | Admitting: Family Medicine

## 2013-04-02 VITALS — BP 120/68 | HR 78 | Temp 98.0°F | Resp 16 | Wt 221.0 lb

## 2013-04-02 DIAGNOSIS — Z5189 Encounter for other specified aftercare: Secondary | ICD-10-CM

## 2013-04-02 DIAGNOSIS — T7840XD Allergy, unspecified, subsequent encounter: Secondary | ICD-10-CM

## 2013-04-02 MED ORDER — PREDNISONE 20 MG PO TABS: ORAL_TABLET | ORAL | Status: DC

## 2013-04-02 MED ORDER — METHYLPREDNISOLONE ACETATE 40 MG/ML IJ SUSP
80.0000 mg | Freq: Once | INTRAMUSCULAR | Status: AC
  Administered 2013-04-02: 80 mg via INTRAMUSCULAR

## 2013-04-02 NOTE — Progress Notes (Signed)
Subjective:    Patient ID: Jonathan Wells, male    DOB: 1941/05/04, 72 y.o.   MRN: [id number]  HPI 04/01/13 Awoke Saturday morning with erythema, swelling, and edema around both eyes and both cheeks. There is no pain. His skin itches badly. He denies any fever or chills or signs of systemic illness. He denies any wheezing or stridor. He has no allergic contacts that he is aware of including ingestions or contacts.  At that time, my plan was: Assessment & Plan:  Most likely an allergic reaction with erysipelas less likely.  Begin Benadryl 25 mg every 6 hours. The patient was given Depo-Medrol 80 mg IM times one here. I will start him on Keflex 500 mg by mouth 3 times a day for 7 days just to cover for possible erysipelas. I asked him to recheck here in 24 hours or go to the emergency room if worsening. I also asked him to discontinue lisinopril.  04/02/13 He is here today for recheck.  He states the redness still itches.  There is significant swelling still around the eyes and in the cheeks but it is noticeably better than yesterday.  He denies any fevers or chills. He denies any pain with extraocular movement.    Past Medical History  Diagnosis Date  . Stroke 1996  . Hyperlipidemia   . Hypertension   . COPD (chronic obstructive pulmonary disease)   . BPH (benign prostatic hypertrophy)   . PVD (peripheral vascular disease)   . Claudication   . Obesity   . Diabetes mellitus    Current Outpatient Prescriptions on File Prior to Visit  Medication Sig Dispense Refill  . Cholecalciferol (VITAMIN D) 2000 UNITS tablet 2,000 Units. 3 caps. Po qd        . fenofibrate micronized (LOFIBRA) 134 MG capsule TAKE ONE CAPSULE BY MOUTH EVERY DAY  30 capsule  5  . HYDROcodone-acetaminophen (NORCO/VICODIN) 5-325 MG per tablet TAKE 1 TABLET BY MOUTH EVERY 6 HOURS AS NEEDED FOR PAIN  120 tablet  0  . HYDROcodone-acetaminophen (VICODIN) 5-500 MG per tablet Take 1 tablet by mouth every 6 (six) hours as needed.   120 tablet  0  . lisinopril (PRINIVIL,ZESTRIL) 5 MG tablet TAKE 1 TABLET BY MOUTH EVERY DAY  30 tablet  1  . metFORMIN (GLUCOPHAGE) 1000 MG tablet Take 1,000 mg by mouth 2 (two) times daily with a meal.        . nortriptyline (PAMELOR) 25 MG capsule Take 25 mg by mouth at bedtime.        . sildenafil (VIAGRA) 100 MG tablet Take 100 mg by mouth daily as needed.        . simvastatin (ZOCOR) 80 MG tablet 80 mg. 1/2 tab po qhs         No current facility-administered medications on file prior to visit.   Marland [facility]oc Allergies  Allergen Reactions  . Lipitor (Atorvastatin Calcium)     Leg pain      Review of Systems  All other systems reviewed and are negative.       Objective:   Physical Exam  Vitals reviewed. Constitutional: He appears well-developed and well-nourished.  HENT:  Head: Head is with right periorbital erythema and with left periorbital erythema.  Mouth/Throat: Uvula is midline, oropharynx is clear and moist and mucous membranes are normal.  Neck: Neck supple.  Cardiovascular: Normal rate, regular rhythm and normal heart sounds.   No murmur heard. Pulmonary/Chest: Effort normal and breath sounds normal. No respiratory  distress. He has no wheezes. He has no rales. He exhibits no tenderness.  Abdominal: Soft. Bowel sounds are normal.  Lymphadenopathy:    He has no cervical adenopathy.  Skin: Rash noted. There is erythema.   Patient still has significant periorbital edema and erythema. It is route to the touch today. The swelling and erythema have lessened noticeably.      Assessment & Plan:  1. Allergic reaction, subsequent encounter Depo-Medrol 80 mg IM x1 now.  Continue Benadryl 25 mg Q6 hours. Finish the Keflex to cover for possible erysipelas. Starting tomorrow begin a prednisone taper. Follow up  Friday or sooner if worse. - predniSONE (DELTASONE) 20 MG tablet; 3 tabs poqday 1-2, 2 tabs poqday 3-4, 1 tab poqday 5-6  Dispense: 12 tablet; Refill: 0 -  methylPREDNISolone acetate (DEPO-MEDROL) injection 80 mg; Inject 2 mLs (80 mg total) into the muscle once.

## 2013-04-05 ENCOUNTER — Encounter: Payer: Self-pay | Admitting: Family Medicine

## 2013-04-05 ENCOUNTER — Ambulatory Visit (INDEPENDENT_AMBULATORY_CARE_PROVIDER_SITE_OTHER): Payer: Medicare Other | Admitting: Family Medicine

## 2013-04-05 VITALS — BP 132/78 | HR 82 | Temp 98.6°F | Resp 18 | Wt 223.0 lb

## 2013-04-05 DIAGNOSIS — T7840XD Allergy, unspecified, subsequent encounter: Secondary | ICD-10-CM

## 2013-04-05 DIAGNOSIS — Z5189 Encounter for other specified aftercare: Secondary | ICD-10-CM

## 2013-04-05 NOTE — Progress Notes (Signed)
Subjective:    Patient ID: Jonathan Wells, male    DOB: Jul 29, 1941, 72 y.o.   MRN: [id number]  HPI 04/01/13 Awoke Saturday morning with erythema, swelling, and edema around both eyes and both cheeks. There is no pain. His skin itches badly. He denies any fever or chills or signs of systemic illness. He denies any wheezing or stridor. He has no allergic contacts that he is aware of including ingestions or contacts.  At that time, my plan was: Assessment & Plan:  Most likely an allergic reaction with erysipelas less likely.  Begin Benadryl 25 mg every 6 hours. The patient was given Depo-Medrol 80 mg IM times one here. I will start him on Keflex 500 mg by mouth 3 times a day for 7 days just to cover for possible erysipelas. I asked him to recheck here in 24 hours or go to the emergency room if worsening. I also asked him to discontinue lisinopril.  04/02/13 He is here today for recheck.  He states the redness still itches.  There is significant swelling still around the eyes and in the cheeks but it is noticeably better than yesterday.  He denies any fevers or chills. He denies any pain with extraocular movement.  At that time, my plan was: 1. Allergic reaction, subsequent encounter Depo-Medrol 80 mg IM x1 now.  Continue Benadryl 25 mg Q6 hours. Finish the Keflex to cover for possible erysipelas. Starting tomorrow begin a prednisone taper. Follow up  Friday or sooner if worse. - predniSONE (DELTASONE) 20 MG tablet; 3 tabs poqday 1-2, 2 tabs poqday 3-4, 1 tab poqday 5-6  Dispense: 12 tablet; Refill: 0 - methylPREDNISolone acetate (DEPO-MEDROL) injection 80 mg; Inject 2 mLs (80 mg total) into the muscle once.  Patient is here today for followup. The redness swelling and edema around his eyes is 90% resolved. He continues to have some slight itching around his right eye but he is markedly improved. He denies stridor or shortness of breath. He denies any edema. He is still not taking the lisinopril. He has 3  more days of prednisone. He is also taking Keflex.   Past Medical History  Diagnosis Date  . Stroke 1996  . Hyperlipidemia   . Hypertension   . COPD (chronic obstructive pulmonary disease)   . BPH (benign prostatic hypertrophy)   . PVD (peripheral vascular disease)   . Claudication   . Obesity   . Diabetes mellitus    Current Outpatient Prescriptions on File Prior to Visit  Medication Sig Dispense Refill  . Cholecalciferol (VITAMIN D) 2000 UNITS tablet 2,000 Units. 3 caps. Po qd        . fenofibrate micronized (LOFIBRA) 134 MG capsule TAKE ONE CAPSULE BY MOUTH EVERY DAY  30 capsule  5  . HYDROcodone-acetaminophen (NORCO/VICODIN) 5-325 MG per tablet TAKE 1 TABLET BY MOUTH EVERY 6 HOURS AS NEEDED FOR PAIN  120 tablet  0  . lisinopril (PRINIVIL,ZESTRIL) 5 MG tablet TAKE 1 TABLET BY MOUTH EVERY DAY  30 tablet  1  . metFORMIN (GLUCOPHAGE) 1000 MG tablet Take 1,000 mg by mouth 2 (two) times daily with a meal.        . nortriptyline (PAMELOR) 25 MG capsule Take 25 mg by mouth at bedtime.        . predniSONE (DELTASONE) 20 MG tablet 3 tabs poqday 1-2, 2 tabs poqday 3-4, 1 tab poqday 5-6  12 tablet  0  . sildenafil (VIAGRA) 100 MG tablet Take 100 mg by mouth  daily as needed.        . simvastatin (ZOCOR) 80 MG tablet 80 mg. 1/2 tab po qhs         No current facility-administered medications on file prior to visit.   Marland [facility] Allergies  Allergen Reactions  . Lipitor (Atorvastatin Calcium)     Leg pain      Review of Systems  All other systems reviewed and are negative.       Objective:   Physical Exam  Vitals reviewed. Constitutional: He appears well-developed and well-nourished.  HENT:  Head: Head is without right periorbital erythema and without left periorbital erythema.  Mouth/Throat: Uvula is midline, oropharynx is clear and moist and mucous membranes are normal.  Neck: Neck supple.  Cardiovascular: Normal rate, regular rhythm and normal heart sounds.   No murmur  heard. Pulmonary/Chest: Effort normal and breath sounds normal. No respiratory distress. He has no wheezes. He has no rales. He exhibits no tenderness.  Abdominal: Soft. Bowel sounds are normal.  Lymphadenopathy:    He has no cervical adenopathy.  Skin: Rash noted. No erythema.        Assessment & Plan:  1. Allergic reaction, subsequent encounter 90% improved.  I asked the patient to finish the prednisone as well as the Keflex. He can now decrease the use of Benadryl. He is to followup with me next week if symptoms are not completely resolved, but her immediately if symptoms return.  I suspect he had a topical contact dermatitis to some type of chemical on his hands that he rubbed on his face possibly rhus dermatitis.  If symptoms return, we will need to undergo an evaluation to determine the allergic trigger.

## 2013-04-08 ENCOUNTER — Other Ambulatory Visit: Payer: Self-pay | Admitting: Family Medicine

## 2013-04-11 ENCOUNTER — Other Ambulatory Visit: Payer: Self-pay | Admitting: Family Medicine

## 2013-04-11 NOTE — Telephone Encounter (Signed)
Ok

## 2013-04-11 NOTE — Telephone Encounter (Signed)
?   Ok to refill,last refill 03/13/13 °

## 2013-04-11 NOTE — Telephone Encounter (Signed)
Med refilled.

## 2013-05-10 ENCOUNTER — Other Ambulatory Visit: Payer: Self-pay | Admitting: Family Medicine

## 2013-05-10 NOTE — Telephone Encounter (Signed)
Med phoned in °

## 2013-05-10 NOTE — Telephone Encounter (Signed)
Ok to refill 

## 2013-06-05 ENCOUNTER — Other Ambulatory Visit: Payer: Self-pay | Admitting: Family Medicine

## 2013-06-06 ENCOUNTER — Other Ambulatory Visit: Payer: Self-pay | Admitting: Family Medicine

## 2013-06-10 ENCOUNTER — Other Ambulatory Visit: Payer: Self-pay | Admitting: Family Medicine

## 2013-06-10 ENCOUNTER — Encounter: Payer: Self-pay | Admitting: Family Medicine

## 2013-06-10 NOTE — Telephone Encounter (Signed)
ok 

## 2013-06-10 NOTE — Telephone Encounter (Signed)
?   OK to Refill  

## 2013-06-18 ENCOUNTER — Encounter: Payer: Self-pay | Admitting: Family Medicine

## 2013-06-18 ENCOUNTER — Ambulatory Visit (INDEPENDENT_AMBULATORY_CARE_PROVIDER_SITE_OTHER): Payer: Medicare Other | Admitting: Family Medicine

## 2013-06-18 VITALS — BP 120/80 | HR 80 | Temp 97.6°F | Resp 17 | Wt 221.0 lb

## 2013-06-18 DIAGNOSIS — E785 Hyperlipidemia, unspecified: Secondary | ICD-10-CM

## 2013-06-18 DIAGNOSIS — I10 Essential (primary) hypertension: Secondary | ICD-10-CM

## 2013-06-18 DIAGNOSIS — Z23 Encounter for immunization: Secondary | ICD-10-CM

## 2013-06-18 DIAGNOSIS — E119 Type 2 diabetes mellitus without complications: Secondary | ICD-10-CM

## 2013-06-18 LAB — COMPLETE METABOLIC PANEL WITH GFR
ALT: 18 U/L (ref 0–53)
AST: 21 U/L (ref 0–37)
Calcium: 9.8 mg/dL (ref 8.4–10.5)
Chloride: 103 mEq/L (ref 96–112)
Creat: 0.76 mg/dL (ref 0.50–1.35)
Potassium: 4.9 mEq/L (ref 3.5–5.3)
Sodium: 138 mEq/L (ref 135–145)

## 2013-06-18 LAB — MICROALBUMIN, URINE: Microalb, Ur: 0.69 mg/dL (ref 0.00–1.89)

## 2013-06-18 LAB — LIPID PANEL
HDL: 52 mg/dL (ref >39)
Total CHOL/HDL Ratio: 2.4 Ratio

## 2013-06-18 LAB — HEMOGLOBIN A1C: Mean Plasma Glucose: 146 mg/dL — ABNORMAL HIGH (ref <117)

## 2013-06-18 NOTE — Progress Notes (Signed)
Subjective:    Patient ID: Jonathan Wells, male    DOB: 18-Jan-1941, 72 y.o.   MRN: [id number]  HPI  Patient is a 72 year old former smoker with COPD presents today 72 year old former followup of his multiple medical conditions. He has history of diabetes mellitus type 2, mixed hyperlipidemia, and hypertension. He also has history of peripheral vascular disease. He is currently taking fenofibrate 134 mg by mouth daily and simvastatin 40 mg by mouth daily. He denies any myalgias or right upper quadrant pain. His goal LDL is less than 70 given his peripheral vascular disease. He also has mild hypertension. He is currently taking lisinopril 5 mg by mouth daily. He denies any chest pain, shortness of breath, dyspnea on exertion. He also has diabetes mellitus type 2. He is currently taking metformin 1000 mg by mouth twice a day. He denies any hypoglycemic episodes. He is not checking his sugars. He denies any polyuria, polydipsia, or blurred vision. He denies any neuropathy in the feet. He does have some mild claudication with ambulation. He also has chronic back pain for which he takes hydrocodone 5/325 by mouth 4 times a day. Past Medical History  Diagnosis Date  . Stroke 1996  . Hyperlipidemia   . Hypertension   . COPD (chronic obstructive pulmonary disease)   . BPH (benign prostatic hypertrophy)   . PVD (peripheral vascular disease)   . Claudication   . Obesity   . Diabetes mellitus    Past Surgical History  Procedure Laterality Date  . Hernia repair  2005  . Appendectomy  1958  . Rotator cup repair      Left  . Spleen removal  1962  . Carpal tunnel release  1999  . Back surgery  1979    lumbar   Current Outpatient Prescriptions on File Prior to Visit  Medication Sig Dispense Refill  . Cholecalciferol (VITAMIN D) 2000 UNITS tablet 2,000 Units. 3 caps. Po qd        . fenofibrate micronized (LOFIBRA) 134 MG capsule TAKE ONE CAPSULE BY MOUTH EVERY DAY  30 capsule  5  . HYDROcodone-acetaminophen  (NORCO/VICODIN) 5-325 MG per tablet TAKE 1 TABLET BY MOUTH EVERY 6 HOURS AS NEEDED FOR PAIN  120 tablet  0  . lisinopril (PRINIVIL,ZESTRIL) 5 MG tablet TAKE 1 TABLET BY MOUTH EVERY DAY  30 tablet  5  . metFORMIN (GLUCOPHAGE) 1000 MG tablet TAKE 1 TABLET BY MOUTH TWICE A DAY  180 tablet  1  . nortriptyline (PAMELOR) 25 MG capsule TAKE 1 CAPSULE BY MOUTH EVERY EVENING  30 capsule  3  . sildenafil (VIAGRA) 100 MG tablet Take 100 mg by mouth daily as needed.        . simvastatin (ZOCOR) 80 MG tablet 80 mg. 1/2 tab po qhs         No current facility-administered medications on file prior to visit.   Allergies  Allergen Reactions  . Lipitor [Atorvastatin Calcium]     Leg pain   History   Social History  . Marital Status: Single    Spouse Name: N/A    Number of Children: N/A  . Years of Education: N/A   Occupational History  . Not on file.   Social History Main Topics  . Smoking status: Former Smoker    Quit date: 09/13/1995  . Smokeless tobacco: Not on file  . Alcohol Use: Yes  . Drug Use: No  . Sexual Activity: Not on file   Other Topics Concern  . Not  on file   Social History Narrative  . No narrative on file     Review of Systems  All other systems reviewed and are negative.       Objective:   Physical Exam  Constitutional: He appears well-developed and well-nourished.  HENT:  Mouth/Throat: No oropharyngeal exudate.  Eyes: Conjunctivae are normal. Pupils are equal, round, and reactive to light. No scleral icterus.  Neck: Normal range of motion. Neck supple. No JVD present. No thyromegaly present.  Cardiovascular: Normal rate, regular rhythm and normal heart sounds.  Exam reveals no gallop and no friction rub.   No murmur heard. Pulmonary/Chest: Effort normal. No respiratory distress. He has wheezes. He has no rales. He exhibits no tenderness.  Abdominal: Soft. Bowel sounds are normal. He exhibits no distension and no mass. There is no tenderness. There is no  rebound and no guarding.  Musculoskeletal: He exhibits no edema.  Lymphadenopathy:    He has no cervical adenopathy.  Skin: Skin is warm. No rash noted.          Assessment & Plan:   1. Type II or unspecified type diabetes mellitus without mention of complication, not stated as uncontrolled Check a hemoglobin A1c. His goal hemoglobin A1c is less than 6.5. He is taking an aspirin 81 mg by mouth daily. I recommended a diabetic eye exam once a year. Also check a urine microalbumin. If elevated I will increase his lisinopril.Marland Kitchen also gave patient flu shot today. - COMPLETE METABOLIC PANEL WITH GFR - Hemoglobin A1c - Microalbumin, urine  2. HLD (hyperlipidemia) Check fasting lipid panel. Goal LDL is less than 70. - Lipid panel  3. HTN (hypertension) Blood pressures well controlled. Increase lisinopril only if microalbumin is elevated.

## 2013-06-18 NOTE — Addendum Note (Signed)
Addended by: Legrand Rams B on: 06/18/2013 08:50 AM   Modules accepted: Orders

## 2013-07-09 ENCOUNTER — Other Ambulatory Visit: Payer: Self-pay | Admitting: Family Medicine

## 2013-07-09 ENCOUNTER — Telehealth: Payer: Self-pay | Admitting: Family Medicine

## 2013-07-09 MED ORDER — HYDROCODONE-ACETAMINOPHEN 5-325 MG PO TABS: ORAL_TABLET | ORAL | Status: DC

## 2013-07-09 NOTE — Telephone Encounter (Signed)
Pt here requesting prescription for his Hydrocodone.  States he does not have phone to call.  Then stated he will come back for RX in AM.  Wanrted to know if he was going to have to do this every month.  Sadly he was told this was a new State Regulations and Yes he would have to do that.  Needs new printed RX if refill OK

## 2013-07-09 NOTE — Telephone Encounter (Signed)
rx printed on my desk

## 2013-07-31 ENCOUNTER — Other Ambulatory Visit: Payer: Self-pay | Admitting: Physician Assistant

## 2013-08-09 ENCOUNTER — Encounter: Payer: Medicare Other | Admitting: Family Medicine

## 2013-08-09 ENCOUNTER — Encounter: Payer: Self-pay | Admitting: Family Medicine

## 2013-08-09 ENCOUNTER — Ambulatory Visit (INDEPENDENT_AMBULATORY_CARE_PROVIDER_SITE_OTHER): Payer: Medicare Other | Admitting: Family Medicine

## 2013-08-09 DIAGNOSIS — Z23 Encounter for immunization: Secondary | ICD-10-CM

## 2013-08-09 MED ORDER — HYDROCODONE-ACETAMINOPHEN 5-325 MG PO TABS: ORAL_TABLET | ORAL | Status: DC

## 2013-08-12 NOTE — Progress Notes (Signed)
This encounter was created in error - please disregard.

## 2013-08-29 ENCOUNTER — Telehealth: Payer: Self-pay | Admitting: Family Medicine

## 2013-08-29 MED ORDER — HYDROCODONE-ACETAMINOPHEN 5-325 MG PO TABS: ORAL_TABLET | ORAL | Status: DC

## 2013-08-29 NOTE — Telephone Encounter (Signed)
Needs Hydrocodone Rx °

## 2013-08-29 NOTE — Telephone Encounter (Signed)
RX printed, left up front and patient aware to pick up  

## 2013-08-29 NOTE — Telephone Encounter (Signed)
?   OK to Refill  

## 2013-08-29 NOTE — Telephone Encounter (Signed)
OK per [facility]

## 2013-09-09 ENCOUNTER — Other Ambulatory Visit: Payer: Self-pay | Admitting: Family Medicine

## 2013-09-11 ENCOUNTER — Other Ambulatory Visit: Payer: Self-pay | Admitting: Family Medicine

## 2013-09-24 ENCOUNTER — Other Ambulatory Visit: Payer: Self-pay | Admitting: Family Medicine

## 2013-09-26 ENCOUNTER — Telehealth: Payer: Self-pay | Admitting: Family Medicine

## 2013-09-26 NOTE — Telephone Encounter (Signed)
PT is needing a refill on his hydrocodone Call back number is [phone]

## 2013-09-26 NOTE — Telephone Encounter (Signed)
?   OK to Refill  

## 2013-09-27 MED ORDER — HYDROCODONE-ACETAMINOPHEN 5-325 MG PO TABS
ORAL_TABLET | ORAL | Status: DC
Start: 2013-09-27 — End: 2013-09-27

## 2013-09-27 MED ORDER — HYDROCODONE-ACETAMINOPHEN 5-325 MG PO TABS: ORAL_TABLET | ORAL | Status: DC

## 2013-09-27 NOTE — Telephone Encounter (Signed)
ok 

## 2013-09-27 NOTE — Telephone Encounter (Signed)
RX printed x 3 months, left up front and patient aware to pick up per vm 

## 2013-10-21 ENCOUNTER — Telehealth: Payer: Self-pay | Admitting: Family Medicine

## 2013-10-21 NOTE — Telephone Encounter (Signed)
Pt states he has lost his prescription of hydrocodone and he needs another on Call back number is [phone]

## 2013-10-21 NOTE — Telephone Encounter (Signed)
.?   OK to Refill - I gave him 3 months worth of rx's in January?!?

## 2013-10-22 MED ORDER — HYDROCODONE-ACETAMINOPHEN 5-325 MG PO TABS: ORAL_TABLET | ORAL | Status: DC

## 2013-10-22 NOTE — Telephone Encounter (Signed)
Ok to refill 1 month at a time and run a drug search on patient.

## 2013-10-22 NOTE — Telephone Encounter (Signed)
RX printed and given to pt rx can not be filled until 10/28/13 and this was put on rx.

## 2013-10-22 NOTE — Telephone Encounter (Signed)
Drug search came back with no discrepancies in when he is getting it filled.

## 2013-12-17 ENCOUNTER — Encounter: Payer: Self-pay | Admitting: Family Medicine

## 2013-12-17 ENCOUNTER — Ambulatory Visit (INDEPENDENT_AMBULATORY_CARE_PROVIDER_SITE_OTHER): Payer: Medicare Other | Admitting: Family Medicine

## 2013-12-17 VITALS — BP 106/68 | HR 78 | Temp 97.4°F | Resp 18 | Ht 70.0 in | Wt 221.0 lb

## 2013-12-17 DIAGNOSIS — E785 Hyperlipidemia, unspecified: Secondary | ICD-10-CM

## 2013-12-17 DIAGNOSIS — I10 Essential (primary) hypertension: Secondary | ICD-10-CM

## 2013-12-17 DIAGNOSIS — E119 Type 2 diabetes mellitus without complications: Secondary | ICD-10-CM

## 2013-12-17 LAB — COMPLETE METABOLIC PANEL WITH GFR
ALK PHOS: 44 U/L (ref 39–117)
ALT: 15 U/L (ref 0–53)
AST: 18 U/L (ref 0–37)
Albumin: 3.9 g/dL (ref 3.5–5.2)
BILIRUBIN TOTAL: 0.4 mg/dL (ref 0.2–1.2)
BUN: 12 mg/dL (ref 6–23)
CO2: 27 mEq/L (ref 19–32)
CREATININE: 0.82 mg/dL (ref 0.50–1.35)
Calcium: 9.3 mg/dL (ref 8.4–10.5)
Chloride: 102 mEq/L (ref 96–112)
GFR, Est Non African American: >89 mL/min
Glucose, Bld: 114 mg/dL — ABNORMAL HIGH (ref 70–99)
Potassium: 4.3 mEq/L (ref 3.5–5.3)
Sodium: 138 mEq/L (ref 135–145)
Total Protein: 6.7 g/dL (ref 6.0–8.3)

## 2013-12-17 LAB — LIPID PANEL
Cholesterol: 134 mg/dL (ref 0–200)
HDL: 45 mg/dL (ref >39)
LDL Cholesterol: 75 mg/dL (ref 0–99)
Total CHOL/HDL Ratio: 3 Ratio
Triglycerides: 69 mg/dL (ref <150)
VLDL: 14 mg/dL (ref 0–40)

## 2013-12-17 LAB — HEMOGLOBIN A1C
Hgb A1c MFr Bld: 6.6 % — ABNORMAL HIGH (ref <5.7)
MEAN PLASMA GLUCOSE: 143 mg/dL — AB (ref <117)

## 2013-12-17 NOTE — Progress Notes (Signed)
Subjective:    Patient ID: Jonathan Wells, male    DOB: [birth year], 73 y.o.   MRN: [id number]  HPI 73 year old white male here today for followup of his hypertension, hyperlipidemia, and diabetes mellitus type 2. He denies any chest pain, shortness of breath, dyspnea on exertion. He denies any myalgias or right upper quadrant pain. He denies any polyuria, polydipsia, blurred vision. He is over due today for a fasting lipid panel, hemoglobin A1c, urine microalbumin. Otherwise he is asymptomatic. His blood pressures well controlled at 106/68.  Unfortunately brother recently died from lung cancer Past Medical History  Diagnosis Date  . Stroke 1996  . Hyperlipidemia   . Hypertension   . COPD (chronic obstructive pulmonary disease)   . BPH (benign prostatic hypertrophy)   . PVD (peripheral vascular disease)   . Claudication   . Obesity   . Diabetes mellitus    Past Surgical History  Procedure Laterality Date  . Hernia repair  2005  . Appendectomy  1958  . Rotator cup repair      Left  . Spleen removal  1962  . Carpal tunnel release  1999  . Back surgery  1979    lumbar   Current Outpatient Prescriptions on File Prior to Visit  Medication Sig Dispense Refill  . Cholecalciferol (VITAMIN D) 2000 UNITS tablet 2,000 Units. 3 caps. Po qd        . fenofibrate micronized (LOFIBRA) 134 MG capsule TAKE ONE CAPSULE BY MOUTH EVERY DAY  30 capsule  5  . HYDROcodone-acetaminophen (NORCO/VICODIN) 5-325 MG per tablet TAKE 1 TABLET BY MOUTH EVERY 6 HOURS AS NEEDED FOR PAIN  120 tablet  0  . lisinopril (PRINIVIL,ZESTRIL) 5 MG tablet TAKE 1 TABLET BY MOUTH EVERY DAY  30 tablet  5  . metFORMIN (GLUCOPHAGE) 1000 MG tablet TAKE 1 TABLET BY MOUTH TWICE A DAY  180 tablet  1  . nortriptyline (PAMELOR) 25 MG capsule TAKE 1 CAPSULE BY MOUTH EVERY EVENING  30 capsule  3  . sildenafil (VIAGRA) 100 MG tablet Take 100 mg by mouth daily as needed.        . simvastatin (ZOCOR) 80 MG tablet TAKE 1 TABLET BY MOUTH AT  BEDTIME  30 tablet  1   No current facility-administered medications on file prior to visit.   Allergies  Allergen Reactions  . Lipitor [Atorvastatin Calcium]     Leg pain   History   Social History  . Marital Status: Single    Spouse Name: N/A    Number of Children: N/A  . Years of Education: N/A   Occupational History  . Not on file.   Social History Main Topics  . Smoking status: Former Smoker    Quit date: 09/13/1995  . Smokeless tobacco: Not on file  . Alcohol Use: Yes  . Drug Use: No  . Sexual Activity: Not on file   Other Topics Concern  . Not on file   Social History Narrative  . No narrative on file   Family History  Problem Relation Age of Onset  . Heart disease Father     MI  . Cancer Sister       Review of Systems  All other systems reviewed and are negative.       Objective:   Physical Exam  Vitals reviewed. Constitutional: He appears well-developed and well-nourished.  HENT:  Nose: Nose normal.  Mouth/Throat: Oropharynx is clear and moist. No oropharyngeal exudate.  Eyes: Conjunctivae are normal. Pupils  are equal, round, and reactive to light.  Neck: Neck supple. No JVD present. No thyromegaly present.  Cardiovascular: Normal rate, regular rhythm, normal heart sounds and intact distal pulses.  Exam reveals no gallop and no friction rub.   No murmur heard. Pulmonary/Chest: Effort normal and breath sounds normal. No respiratory distress. He has no wheezes. He has no rales. He exhibits no tenderness.  Abdominal: Soft. Bowel sounds are normal. He exhibits no distension and no mass. There is no tenderness. There is no rebound and no guarding.  Musculoskeletal: He exhibits no edema.  Lymphadenopathy:    He has no cervical adenopathy.          Assessment & Plan:  1. Type II or unspecified type diabetes mellitus without mention of complication, not stated as uncontrolled Recommended annual eye exam. Diabetic foot exam is normal. Blood  pressures well controlled. Check hemoglobin A1c as well as a urine microalbumin.  Increase lisinopril if urine microalbumin is elevated. - COMPLETE METABOLIC PANEL WITH GFR - Hemoglobin A1c - Lipid panel - Microalbumin, urine  2. HTN (hypertension) Blood pressures well controlled today. No change in medication at this time.  3. HLD (hyperlipidemia) Check fasting lipid panel. Go LDL cholesterol is less than 70 given his history of peripheral vascular disease.  I recommended the patient take aspirin 81 mg by mouth daily.

## 2013-12-18 LAB — MICROALBUMIN, URINE: Microalb, Ur: 0.98 mg/dL (ref 0.00–1.89)

## 2013-12-20 ENCOUNTER — Encounter: Payer: Self-pay | Admitting: Family Medicine

## 2014-01-29 ENCOUNTER — Encounter: Payer: Self-pay | Admitting: *Deleted

## 2014-01-31 ENCOUNTER — Ambulatory Visit (INDEPENDENT_AMBULATORY_CARE_PROVIDER_SITE_OTHER): Payer: Medicare Other | Admitting: Neurology

## 2014-01-31 ENCOUNTER — Encounter: Payer: Self-pay | Admitting: Neurology

## 2014-01-31 ENCOUNTER — Encounter (INDEPENDENT_AMBULATORY_CARE_PROVIDER_SITE_OTHER): Payer: Self-pay

## 2014-01-31 VITALS — BP 116/75 | HR 91 | Temp 97.3°F | Ht 70.0 in

## 2014-01-31 DIAGNOSIS — R413 Other amnesia: Secondary | ICD-10-CM

## 2014-01-31 DIAGNOSIS — R5381 Other malaise: Secondary | ICD-10-CM

## 2014-01-31 DIAGNOSIS — G9340 Encephalopathy, unspecified: Secondary | ICD-10-CM

## 2014-01-31 DIAGNOSIS — R4789 Other speech disturbances: Secondary | ICD-10-CM

## 2014-01-31 DIAGNOSIS — R479 Unspecified speech disturbances: Secondary | ICD-10-CM

## 2014-01-31 DIAGNOSIS — R5383 Other fatigue: Secondary | ICD-10-CM

## 2014-01-31 DIAGNOSIS — G608 Other hereditary and idiopathic neuropathies: Secondary | ICD-10-CM

## 2014-01-31 DIAGNOSIS — R531 Weakness: Secondary | ICD-10-CM

## 2014-01-31 DIAGNOSIS — G231 Progressive supranuclear ophthalmoplegia [Steele-Richardson-Olszewski]: Secondary | ICD-10-CM
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
## 2014-01-31 NOTE — Patient Instructions (Signed)
We will do additional testing. We will do a head CT without contrast and blood work today. We will schedule you for a EEG which is a brain wave test. We will have to schedule you for your head CT. We will request records from [facility], [facility].

## 2014-02-01 LAB — COMPREHENSIVE METABOLIC PANEL
A/G RATIO: 1.4 (ref 1.1–2.5)
ALBUMIN: 4.1 g/dL (ref 3.5–4.8)
ALK PHOS: 87 IU/L (ref 39–117)
ALT: 14 IU/L (ref 0–44)
AST: 21 IU/L (ref 0–40)
BUN / CREAT RATIO: 13 (ref 10–22)
BUN: 8 mg/dL (ref 8–27)
CO2: 21 mmol/L (ref 18–29)
Calcium: 9.7 mg/dL (ref 8.6–10.2)
Chloride: 96 mmol/L — ABNORMAL LOW (ref 97–108)
Creatinine, Ser: 0.63 mg/dL — ABNORMAL LOW (ref 0.76–1.27)
GFR calc Af Amer: 115 mL/min/{1.73_m2} (ref >59)
GFR, EST NON AFRICAN AMERICAN: 100 mL/min/{1.73_m2} (ref >59)
GLUCOSE: 106 mg/dL — AB (ref 65–99)
Globulin, Total: 2.9 g/dL (ref 1.5–4.5)
Potassium: 5.3 mmol/L — ABNORMAL HIGH (ref 3.5–5.2)
Sodium: 135 mmol/L (ref 134–144)
TOTAL PROTEIN: 7 g/dL (ref 6.0–8.5)

## 2014-02-01 LAB — CBC WITH DIFFERENTIAL
BASOS ABS: 0 10*3/uL (ref 0.0–0.2)
Basos: 0 %
EOS ABS: 0.3 10*3/uL (ref 0.0–0.4)
Eos: 3 %
HEMATOCRIT: 40.6 % (ref 37.5–51.0)
Hemoglobin: 13.7 g/dL (ref 12.6–17.7)
Immature Grans (Abs): 0 10*3/uL (ref 0.0–0.1)
Immature Granulocytes: 0 %
LYMPHS ABS: 3.8 10*3/uL — AB (ref 0.7–3.1)
Lymphs: 40 %
MCH: 31.9 pg (ref 26.6–33.0)
MCHC: 33.7 g/dL (ref 31.5–35.7)
MCV: 95 fL (ref 79–97)
Monocytes Absolute: 0.7 10*3/uL (ref 0.1–0.9)
Monocytes: 7 %
NEUTROS ABS: 4.7 10*3/uL (ref 1.4–7.0)
Neutrophils Relative %: 50 %
Platelets: 525 10*3/uL — ABNORMAL HIGH (ref 150–379)
RBC: 4.29 x10E6/uL (ref 4.14–5.80)
RDW: 16 % — AB (ref 12.3–15.4)
WBC: 9.6 10*3/uL (ref 3.4–10.8)

## 2014-02-01 LAB — TSH: TSH: 1.07 u[IU]/mL (ref 0.450–4.500)

## 2014-02-01 LAB — RPR: RPR: NONREACTIVE

## 2014-02-01 LAB — AMMONIA: Ammonia: 96 ug/dL (ref 27–102)

## 2014-02-03 NOTE — Progress Notes (Signed)
Quick Note:  Please advise daughter-in-law that lab tests showed a borderline increase potassium level which needs to be followed by his primary care physician. He also had an increased platelet count which in and of itself is a nonspecific finding. It may need to be rechecked in a few weeks, again typically by his primary care physician. Otherwise, liver function was fine and so was thyroid screening test. I do not see a result on his urine test. We will await his other test results. Huston Foley, MD, PhD [facility]  ______

## 2014-02-06 ENCOUNTER — Ambulatory Visit (INDEPENDENT_AMBULATORY_CARE_PROVIDER_SITE_OTHER): Payer: Medicare Other | Admitting: Radiology

## 2014-02-06 DIAGNOSIS — R479 Unspecified speech disturbances: Secondary | ICD-10-CM

## 2014-02-06 DIAGNOSIS — R413 Other amnesia: Secondary | ICD-10-CM

## 2014-02-06 DIAGNOSIS — G9340 Encephalopathy, unspecified: Secondary | ICD-10-CM

## 2014-02-06 DIAGNOSIS — G231 Progressive supranuclear ophthalmoplegia [Steele-Richardson-Olszewski]: Secondary | ICD-10-CM

## 2014-02-06 DIAGNOSIS — R531 Weakness: Secondary | ICD-10-CM

## 2014-02-06 NOTE — Procedures (Signed)
    History:  Jonathan Wells is a 73 year old gentleman with a history of a progressive memory disturbance and confusion. The patient has had some degree of agitation. The patient is being evaluated for the memory disorder.  This is a routine EEG. No skull defects are noted. Medications include Proventil, vitamin D, Lofibra, lisinopril, Glucophage, Pamelor, and Zocor.   EEG classification: Normal awake  Description of the recording: The background rhythms of this recording consists of a fairly well modulated medium amplitude alpha rhythm of 9 Hz that is reactive to eye opening and closure. As the record progresses, the patient appears to remain in the waking state throughout the recording. Photic stimulation was performed, resulting in a bilateral and symmetric photic driving response. Hyperventilation was not performed. At no time during the recording does there appear to be evidence of spike or spike wave discharges or evidence of focal slowing. EKG monitor shows no evidence of cardiac rhythm abnormalities with a heart rate of 96. Throughout the recording, there is excessive head movement artifact making interpretation of some segments of this recording quite difficult.  Impression: This is a normal EEG recording in the waking state. This is a technically difficult study secondary to excessive head movement artifact. No evidence of ictal or interictal discharges are seen.

## 2014-02-06 NOTE — Progress Notes (Signed)
Quick Note:  Please call patient's son or daughter-in-law and advise them that patient's EEG or brain wave test was reported as normal in the awake state. Huston Foley, MD, PhD [facility]  ______

## 2014-02-06 NOTE — Progress Notes (Signed)
Quick Note:  I called and gave the results of labs to son, Whitton Hellwig. I faxed to Dr. Andrey Campanile at [facility] in [place]. [phone], [phone] ______

## 2014-02-07 ENCOUNTER — Telehealth: Payer: Self-pay | Admitting: Neurology

## 2014-02-07 NOTE — Telephone Encounter (Signed)
Merita Norton at [facility] is calling for EEG results for patient--patient's son said someone from our office was trying to call and give results--patient is now living at [facility] call.

## 2014-02-07 NOTE — Telephone Encounter (Signed)
Pt's facility [facility] was called and spoke with Merita Norton informing her about pt's results and if have any other problems, questions or concerns to call the office. Morrie Sheldon verbalized understanding.

## 2014-02-18 ENCOUNTER — Ambulatory Visit (HOSPITAL_COMMUNITY)
Admission: RE | Admit: 2014-02-18 | Discharge: 2014-02-18 | Disposition: A | Discharge status: Home / Self Care | Payer: Medicare Other | Source: Ambulatory Visit | Attending: Neurology | Admitting: Neurology

## 2014-02-18 ENCOUNTER — Ambulatory Visit (HOSPITAL_COMMUNITY): Payer: Medicare Other

## 2014-02-18 DIAGNOSIS — E119 Type 2 diabetes mellitus without complications: Secondary | ICD-10-CM | POA: Insufficient documentation

## 2014-02-18 DIAGNOSIS — E669 Obesity, unspecified: Secondary | ICD-10-CM | POA: Insufficient documentation

## 2014-02-18 DIAGNOSIS — G231 Progressive supranuclear ophthalmoplegia [Steele-Richardson-Olszewski]: Secondary | ICD-10-CM

## 2014-02-18 DIAGNOSIS — G9340 Encephalopathy, unspecified: Secondary | ICD-10-CM

## 2014-02-18 DIAGNOSIS — I10 Essential (primary) hypertension: Secondary | ICD-10-CM | POA: Insufficient documentation

## 2014-02-18 DIAGNOSIS — E785 Hyperlipidemia, unspecified: Secondary | ICD-10-CM | POA: Insufficient documentation

## 2014-02-18 DIAGNOSIS — R4789 Other speech disturbances: Secondary | ICD-10-CM | POA: Insufficient documentation

## 2014-02-18 DIAGNOSIS — R5383 Other fatigue: Secondary | ICD-10-CM

## 2014-02-18 DIAGNOSIS — R5381 Other malaise: Secondary | ICD-10-CM | POA: Insufficient documentation

## 2014-02-18 DIAGNOSIS — I6789 Other cerebrovascular disease: Secondary | ICD-10-CM | POA: Insufficient documentation

## 2014-02-18 DIAGNOSIS — R413 Other amnesia: Secondary | ICD-10-CM

## 2014-02-18 DIAGNOSIS — R531 Weakness: Secondary | ICD-10-CM

## 2014-02-18 DIAGNOSIS — R479 Unspecified speech disturbances: Secondary | ICD-10-CM

## 2014-02-21 ENCOUNTER — Ambulatory Visit (INDEPENDENT_AMBULATORY_CARE_PROVIDER_SITE_OTHER): Payer: Medicare Other | Admitting: Family Medicine

## 2014-02-21 ENCOUNTER — Encounter: Payer: Self-pay | Admitting: Family Medicine

## 2014-02-21 VITALS — BP 126/72 | HR 88 | Temp 97.3°F | Resp 16 | Ht 70.0 in | Wt 203.0 lb

## 2014-02-21 DIAGNOSIS — E785 Hyperlipidemia, unspecified: Secondary | ICD-10-CM

## 2014-02-21 DIAGNOSIS — E119 Type 2 diabetes mellitus without complications: Secondary | ICD-10-CM

## 2014-02-21 DIAGNOSIS — F0391 Unspecified dementia with behavioral disturbance: Secondary | ICD-10-CM

## 2014-02-21 DIAGNOSIS — G4700 Insomnia, unspecified: Secondary | ICD-10-CM

## 2014-02-21 DIAGNOSIS — F03918 Unspecified dementia, unspecified severity, with other behavioral disturbance: Secondary | ICD-10-CM

## 2014-02-21 DIAGNOSIS — I10 Essential (primary) hypertension: Secondary | ICD-10-CM

## 2014-02-21 LAB — COMPLETE METABOLIC PANEL WITH GFR
ALT: 15 U/L (ref 0–53)
AST: 19 U/L (ref 0–37)
Albumin: 3.8 g/dL (ref 3.5–5.2)
Alkaline Phosphatase: 68 U/L (ref 39–117)
BUN: 9 mg/dL (ref 6–23)
CHLORIDE: 98 meq/L (ref 96–112)
CO2: 24 meq/L (ref 19–32)
Calcium: 9.3 mg/dL (ref 8.4–10.5)
Creat: 0.72 mg/dL (ref 0.50–1.35)
GFR, Est Non African American: >89 mL/min
Glucose, Bld: 121 mg/dL — ABNORMAL HIGH (ref 70–99)
Potassium: 4.4 mEq/L (ref 3.5–5.3)
Sodium: 135 mEq/L (ref 135–145)
Total Bilirubin: 0.2 mg/dL (ref 0.2–1.2)
Total Protein: 6.8 g/dL (ref 6.0–8.3)

## 2014-02-21 LAB — LIPID PANEL
CHOLESTEROL: 129 mg/dL (ref 0–200)
HDL: 51 mg/dL (ref >39)
LDL Cholesterol: 60 mg/dL (ref 0–99)
Total CHOL/HDL Ratio: 2.5 Ratio
Triglycerides: 89 mg/dL (ref <150)
VLDL: 18 mg/dL (ref 0–40)

## 2014-02-21 LAB — HEMOGLOBIN A1C
HEMOGLOBIN A1C: 5.9 % — AB (ref <5.7)
Mean Plasma Glucose: 123 mg/dL — ABNORMAL HIGH (ref <117)

## 2014-02-21 MED ORDER — NORTRIPTYLINE HCL 25 MG PO CAPS: ORAL_CAPSULE | ORAL | Status: DC

## 2014-02-21 MED ORDER — ACCU-CHEK SOFTCLIX LANCETS MISC: Status: AC

## 2014-02-21 MED ORDER — LISINOPRIL 5 MG PO TABS: ORAL_TABLET | ORAL | Status: DC

## 2014-02-21 MED ORDER — FENOFIBRATE MICRONIZED 134 MG PO CAPS: ORAL_CAPSULE | ORAL | Status: AC

## 2014-02-21 MED ORDER — ACCU-CHEK AVIVA PLUS W/DEVICE KIT: PACK | Status: AC

## 2014-02-21 MED ORDER — GLUCOSE BLOOD VI STRP: ORAL_STRIP | Status: AC

## 2014-02-21 MED ORDER — METFORMIN HCL 1000 MG PO TABS: ORAL_TABLET | ORAL | Status: AC

## 2014-02-21 MED ORDER — SIMVASTATIN 80 MG PO TABS
ORAL_TABLET | ORAL | Status: DC
Start: 2014-02-21 — End: 2014-02-25

## 2014-02-21 MED ORDER — LORAZEPAM 0.5 MG PO TABS: 0.5000 mg | ORAL_TABLET | Freq: Every day | ORAL | Status: DC
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
## 2014-02-21 NOTE — Patient Instructions (Signed)
Check blood sugar fasting every morning (goal less than 130) and before bed (goal less than 170).  The med list below should be what he is taking.  He can use ativan at night for sleep.  Report to me his sugars in 1 week.  Let me know if ativan is helping him sleep.

## 2014-02-25 ENCOUNTER — Telehealth: Payer: Self-pay | Admitting: Family Medicine

## 2014-02-25 MED ORDER — HYDROCODONE-ACETAMINOPHEN 5-325 MG PO TABS: ORAL_TABLET | ORAL | Status: DC

## 2014-02-25 NOTE — Telephone Encounter (Signed)
RX printed, left up front and patient aware to pick up and aware to stay on 40mg  of Zocor.

## 2014-02-25 NOTE — Telephone Encounter (Signed)
Continue zocor 40mg  Okay to refill pain medication

## 2014-02-25 NOTE — Telephone Encounter (Signed)
Requesting a refill on his hydrocodone 5/325  - ? OK to Refill. Also Dr. Tanya NonesPickard had told them that he would need to be on Zocor 80mg  however the pt has been on 40mg  and his LDL was 60. WTP wanted it to be below 70 and it is so do you want him on 80mg  or continue the 40mg ??

## 2014-02-26 NOTE — Progress Notes (Signed)
Quick Note:  Please call pt's family (son or daughter-in-law) and advised him that there was nothing acute seen on the recent head CT. No evidence of stroke, hemorrhage or skull fracture. Huston FoleySaima Athar, MD, PhD [facility]  ______

## 2014-02-27 NOTE — Progress Notes (Signed)
Quick Note:  Shared CT Head results with patient's daughter in law(Angel), verbalized understanding ______

## 2014-03-04 ENCOUNTER — Ambulatory Visit: Payer: Medicare Other | Admitting: Neurology

## 2014-03-26 ENCOUNTER — Telehealth: Payer: Self-pay | Admitting: Family Medicine

## 2014-03-26 MED ORDER — HYDROCODONE-ACETAMINOPHEN 5-325 MG PO TABS: ORAL_TABLET | ORAL | Status: DC

## 2014-03-26 NOTE — Telephone Encounter (Signed)
Requesting refill on Hydrocodone - ? OK to Refill

## 2014-03-27 NOTE — Telephone Encounter (Signed)
RX printed x 3 months, left up front and patient's son aware to pick up - per him his GF(?) will be picking up rx's

## 2014-03-27 NOTE — Telephone Encounter (Signed)
ok 

## 2014-03-31 ENCOUNTER — Ambulatory Visit (INDEPENDENT_AMBULATORY_CARE_PROVIDER_SITE_OTHER): Payer: Medicare Other | Admitting: Family Medicine

## 2014-03-31 ENCOUNTER — Encounter: Payer: Self-pay | Admitting: Family Medicine

## 2014-03-31 VITALS — BP 110/70 | HR 96 | Temp 97.4°F | Resp 14 | Ht 70.0 in | Wt 190.0 lb

## 2014-03-31 DIAGNOSIS — R0982 Postnasal drip: Secondary | ICD-10-CM

## 2014-03-31 DIAGNOSIS — H579 Unspecified disorder of eye and adnexa: Secondary | ICD-10-CM

## 2014-03-31 MED ORDER — FLUTICASONE PROPIONATE 50 MCG/ACT NA SUSP: 2.0000 | Freq: Every day | NASAL | Status: AC

## 2014-03-31 NOTE — Progress Notes (Signed)
Subjective:    Patient ID: Jonathan Wells, male    DOB: 1941/03/05, 73 y.o.   MRN: [id number]  HPI  Last week, the patient's right eye became erythematous and swollen. He denied pain in his right eye. He denied injury to his right eye. There was no drainage emanating from his right eye.  Patient's family treated his symptoms with over-the-counter eyedrops. His right eye is now completely asymptomatic. There is no conjunctival injection or erythema. There is no swelling in the periorbital tissue. Patient has full and painless range of motion in the right. He denies any blurry vision. On fluorescein exam, there are no dendrites, corneal abrasion, or ulcers seen. There are no foreign bodies seen underneath his eyelid.  He also complains of thick phlegm sticking in the back of his throat. He does take nortriptyline which is an anti-cholinergic medication which may be thickening his nasal secretions. He denies any dysphagia. He denies any food or liquid sticking with swallowing. He denies any stridor or difficulty breathing. There are no palpable masses in his neck. There is no lymphadenopathy or thyromegaly. Examination of his oral pharynx is clear today. Past Medical History  Diagnosis Date  . Stroke 1996  . Hyperlipidemia   . Hypertension   . COPD (chronic obstructive pulmonary disease)   . BPH (benign prostatic hypertrophy)   . PVD (peripheral vascular disease)   . Claudication   . Obesity   . Diabetes mellitus   . Bacteremia   . H/O bladder infections   . Chicken pox   . Measles   . Mumps   . Peripheral artery disease   . Dementia     possible supranuclear palsy   Past Surgical History  Procedure Laterality Date  . Hernia repair  2005  . Appendectomy  1958  . Rotator cup repair      Left  . Spleen removal  1962  . Carpal tunnel release  1999  . Back surgery  1979    lumbar  . Aorto-bifemoral bypass     Current Outpatient Prescriptions on File Prior to Visit  Medication Sig  Dispense Refill  . ACCU-CHEK SOFTCLIX LANCETS lancets Use as instructed  100 each  5  . acetaminophen (TYLENOL) 500 MG tablet Take 500 mg by mouth every 6 (six) hours as needed.      Marland Kitchen aspirin 81 MG tablet Take 81 mg by mouth daily.      . Blood Glucose Monitoring Suppl (ACCU-CHEK AVIVA PLUS) W/DEVICE KIT Use as directed  1 kit  0  . Cholecalciferol (VITAMIN D) 2000 UNITS tablet 2,000 Units. 3 caps. Po qd        . fenofibrate micronized (LOFIBRA) 134 MG capsule TAKE ONE CAPSULE BY MOUTH EVERY DAY  90 capsule  3  . glucose blood (ACCU-CHEK AVIVA PLUS) test strip Checks BS bid - DX - 250.00  100 each  5  . HYDROcodone-acetaminophen (NORCO/VICODIN) 5-325 MG per tablet TAKE 1 TABLET BY MOUTH EVERY 6 HOURS AS NEEDED FOR PAIN  120 tablet  0  . lisinopril (PRINIVIL,ZESTRIL) 5 MG tablet TAKE 1 TABLET BY MOUTH EVERY DAY  90 tablet  3  . LORazepam (ATIVAN) 0.5 MG tablet Take 1 tablet (0.5 mg total) by mouth at bedtime.  30 tablet  1  . metFORMIN (GLUCOPHAGE) 1000 MG tablet TAKE 1 TABLET BY MOUTH TWICE A DAY  180 tablet  3  . nortriptyline (PAMELOR) 25 MG capsule TAKE 1 CAPSULE BY MOUTH EVERY EVENING  90  capsule  3  . simvastatin (ZOCOR) 40 MG tablet Take 40 mg by mouth daily.       No current facility-administered medications on file prior to visit.   Allergies  Allergen Reactions  . Lipitor [Atorvastatin Calcium]     Leg pain   History   Social History  . Marital Status: Single    Spouse Name: N/A    Number of Children: N/A  . Years of Education: N/A   Occupational History  . Not on file.   Social History Main Topics  . Smoking status: Former Smoker    Quit date: 09/13/1995  . Smokeless tobacco: Not on file  . Alcohol Use: Yes  . Drug Use: No  . Sexual Activity: Not on file   Other Topics Concern  . Not on file   Social History Narrative  . No narrative on file     Review of Systems  All other systems reviewed and are negative.      Objective:   Physical Exam  Vitals  reviewed. Constitutional: He appears well-developed and well-nourished.  HENT:  Mouth/Throat: Oropharynx is clear and moist. No oropharyngeal exudate.  Eyes: Conjunctivae and EOM are normal. Pupils are equal, round, and reactive to light. Right eye exhibits no discharge. Left eye exhibits no discharge. No scleral icterus.  Neck: Neck supple. No JVD present. No thyromegaly present.  Cardiovascular: Normal rate, regular rhythm and normal heart sounds.   Pulmonary/Chest: Effort normal and breath sounds normal. No respiratory distress. He has no wheezes. He has no rales.  Abdominal: Soft. Bowel sounds are normal. He exhibits no distension. There is no tenderness. There is no rebound and no guarding.  Lymphadenopathy:    He has no cervical adenopathy.          Assessment & Plan:  1. Right eye symptoms Patient's exam today is completely normal. I expect the patient may of had mild conjunctivitis that resolved spontaneously on its own.  Today the patient is completely asymptomatic with normal exam. Followup if necessary for this in the future.  2. Post-nasal drip Patient symptoms may be due to thick postnasal drip. Therefore I will start the patient on Flonase 2 sprays each nostril daily. Also recommended nasal saline as needed. If symptoms persist, consider ENT evaluation for examination of the larynx to rule out obstructive mass.

## 2014-04-02 ENCOUNTER — Telehealth: Payer: Self-pay | Admitting: Family Medicine

## 2014-04-02 NOTE — Telephone Encounter (Signed)
I left a message this morning for pt to call back. Needing to schedule pt for [employer] and CPE

## 2014-04-22 ENCOUNTER — Other Ambulatory Visit: Payer: Self-pay | Admitting: Family Medicine

## 2014-04-22 ENCOUNTER — Telehealth: Payer: Self-pay | Admitting: Family Medicine

## 2014-04-22 NOTE — Telephone Encounter (Signed)
ok 

## 2014-04-22 NOTE — Telephone Encounter (Signed)
[phone]   PT is needing a refill on LORazepam (ATIVAN) 0.5 MG tablet ----Pt is out of this medication   [facility]

## 2014-04-22 NOTE — Telephone Encounter (Signed)
Ok to refill??  Last office visit 03/31/2014.  Last refill 02/21/2014, #1 refill.

## 2014-04-23 NOTE — Telephone Encounter (Signed)
Medication called to pharmacy. 

## 2014-06-17 ENCOUNTER — Encounter: Payer: Self-pay | Admitting: Family Medicine

## 2014-06-23 ENCOUNTER — Telehealth: Payer: Self-pay | Admitting: Family Medicine

## 2014-06-23 ENCOUNTER — Other Ambulatory Visit: Payer: Self-pay | Admitting: Family Medicine

## 2014-06-23 ENCOUNTER — Ambulatory Visit: Payer: Medicare Other | Admitting: Family Medicine

## 2014-06-23 NOTE — Telephone Encounter (Signed)
Ok to refill??  Last office visit 03/31/2014.  Last refill 04/23/2014, #1 refill.

## 2014-06-23 NOTE — Telephone Encounter (Signed)
[phone] PT is needing a refill on  HYDROcodone-acetaminophen (NORCO/VICODIN) 5-325 MG per tablet   Pt son also has questions about his medication and he states that you can speak to his gf if he doesn't answer.

## 2014-06-23 NOTE — Telephone Encounter (Signed)
Spoke to pts son and he is taking Ativan to help him sleep at night and it is not working and wondering if he could have something else? Also need a refill on his pain med - ? OK to Refill. He wanted to know if we could give him something different and I told him he needed OV to change that type of medication. Appt was already made for 10/20.

## 2014-06-23 NOTE — Telephone Encounter (Signed)
ok 

## 2014-06-23 NOTE — Telephone Encounter (Signed)
Medication called to pharmacy. 

## 2014-06-24 MED ORDER — HYDROCODONE-ACETAMINOPHEN 5-325 MG PO TABS: ORAL_TABLET | ORAL | Status: DC

## 2014-06-24 NOTE — Telephone Encounter (Signed)
Ok with rf of pain med but i agree ntbs for any med changes.

## 2014-06-24 NOTE — Telephone Encounter (Signed)
Pts son aware, rx printed and left up front and will discuss med changes at his appt 07/01/14.

## 2014-07-01 ENCOUNTER — Ambulatory Visit (INDEPENDENT_AMBULATORY_CARE_PROVIDER_SITE_OTHER): Payer: Medicare Other | Admitting: Family Medicine

## 2014-07-01 ENCOUNTER — Encounter: Payer: Self-pay | Admitting: Family Medicine

## 2014-07-01 VITALS — BP 110/64 | HR 94 | Temp 97.9°F | Resp 14 | Ht 70.0 in | Wt 177.0 lb

## 2014-07-01 DIAGNOSIS — E119 Type 2 diabetes mellitus without complications: Secondary | ICD-10-CM

## 2014-07-01 DIAGNOSIS — I10 Essential (primary) hypertension: Secondary | ICD-10-CM

## 2014-07-01 DIAGNOSIS — R8299 Other abnormal findings in urine: Secondary | ICD-10-CM

## 2014-07-01 DIAGNOSIS — Z23 Encounter for immunization: Secondary | ICD-10-CM

## 2014-07-01 DIAGNOSIS — F0390 Unspecified dementia without behavioral disturbance: Secondary | ICD-10-CM

## 2014-07-01 DIAGNOSIS — G4700 Insomnia, unspecified: Secondary | ICD-10-CM

## 2014-07-01 DIAGNOSIS — R8290 Unspecified abnormal findings in urine: Secondary | ICD-10-CM

## 2014-07-01 LAB — URINALYSIS, MICROSCOPIC ONLY
Casts: NONE SEEN
Crystals: NONE SEEN

## 2014-07-01 LAB — CBC WITH DIFFERENTIAL/PLATELET
Basophils Absolute: 0 10*3/uL (ref 0.0–0.1)
Basophils Relative: 0 % (ref 0–1)
Eosinophils Absolute: 0.1 10*3/uL (ref 0.0–0.7)
Eosinophils Relative: 1 % (ref 0–5)
HCT: 45.6 % (ref 39.0–52.0)
HEMOGLOBIN: 15.5 g/dL (ref 13.0–17.0)
LYMPHS ABS: 3.5 10*3/uL (ref 0.7–4.0)
Lymphocytes Relative: 34 % (ref 12–46)
MCH: 30.8 pg (ref 26.0–34.0)
MCHC: 34 g/dL (ref 30.0–36.0)
MCV: 90.5 fL (ref 78.0–100.0)
MONOS PCT: 9 % (ref 3–12)
Monocytes Absolute: 0.9 10*3/uL (ref 0.1–1.0)
NEUTROS PCT: 56 % (ref 43–77)
Neutro Abs: 5.7 10*3/uL (ref 1.7–7.7)
Platelets: 449 10*3/uL — ABNORMAL HIGH (ref 150–400)
RBC: 5.04 MIL/uL (ref 4.22–5.81)
RDW: 17 % — ABNORMAL HIGH (ref 11.5–15.5)
WBC: 10.2 10*3/uL (ref 4.0–10.5)

## 2014-07-01 LAB — COMPLETE METABOLIC PANEL WITH GFR
ALT: 15 U/L (ref 0–53)
AST: 30 U/L (ref 0–37)
Albumin: 4 g/dL (ref 3.5–5.2)
Alkaline Phosphatase: 49 U/L (ref 39–117)
BILIRUBIN TOTAL: 0.4 mg/dL (ref 0.2–1.2)
BUN: 8 mg/dL (ref 6–23)
CO2: 27 mEq/L (ref 19–32)
CREATININE: 0.72 mg/dL (ref 0.50–1.35)
Calcium: 10.1 mg/dL (ref 8.4–10.5)
Chloride: 98 mEq/L (ref 96–112)
GFR, Est Non African American: >89 mL/min
GLUCOSE: 112 mg/dL — AB (ref 70–99)
Potassium: 5.5 mEq/L — ABNORMAL HIGH (ref 3.5–5.3)
Sodium: 134 mEq/L — ABNORMAL LOW (ref 135–145)
Total Protein: 6.7 g/dL (ref 6.0–8.3)

## 2014-07-01 LAB — URINALYSIS, ROUTINE W REFLEX MICROSCOPIC
Bilirubin Urine: NEGATIVE
Glucose, UA: NEGATIVE mg/dL
Ketones, ur: NEGATIVE mg/dL
NITRITE: POSITIVE — AB
Protein, ur: NEGATIVE mg/dL
UROBILINOGEN UA: 0.2 mg/dL (ref 0.0–1.0)
pH: 6.5 (ref 5.0–8.0)

## 2014-07-01 LAB — HEMOGLOBIN A1C
Hgb A1c MFr Bld: 6.3 % — ABNORMAL HIGH (ref <5.7)
MEAN PLASMA GLUCOSE: 134 mg/dL — AB (ref <117)

## 2014-07-01 MED ORDER — QUETIAPINE FUMARATE 25 MG PO TABS: 25.0000 mg | ORAL_TABLET | Freq: Every day | ORAL | Status: DC
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
## 2014-07-02 ENCOUNTER — Telehealth: Payer: Self-pay | Admitting: Family Medicine

## 2014-07-02 LAB — PSA, MEDICARE: PSA: 1 ng/mL (ref <=4.00)

## 2014-07-02 MED ORDER — HYDROCODONE-ACETAMINOPHEN 7.5-325 MG PO TABS: 1.0000 | ORAL_TABLET | Freq: Four times a day (QID) | ORAL | Status: DC | PRN

## 2014-07-02 NOTE — Telephone Encounter (Signed)
pts son called and wanted to know if you could increase his pain medication?  Per [facility] can increase to 7.5/325 mg and see if that helps.  RX printed, left up front and patient's son aware to pick up

## 2014-07-03 ENCOUNTER — Telehealth: Payer: Self-pay | Admitting: Family Medicine

## 2014-07-03 ENCOUNTER — Other Ambulatory Visit: Payer: Self-pay | Admitting: Family Medicine

## 2014-07-03 MED ORDER — CIPROFLOXACIN HCL 500 MG PO TABS: 500.0000 mg | ORAL_TABLET | Freq: Two times a day (BID) | ORAL | Status: DC

## 2014-07-16 ENCOUNTER — Telehealth: Payer: Self-pay | Admitting: Neurology

## 2014-07-16 NOTE — Telephone Encounter (Signed)
Spoke with caretaker, rescheduled appt with caretaker(Angel), confirmed for 07/18/14

## 2014-07-16 NOTE — Telephone Encounter (Signed)
Called and spoke with caretaker(Angel) and confirmed appt change for 07/18/14

## 2014-07-18 ENCOUNTER — Encounter: Payer: Self-pay | Admitting: Neurology

## 2014-07-18 ENCOUNTER — Ambulatory Visit: Payer: Medicare Other | Admitting: Neurology

## 2014-07-18 ENCOUNTER — Ambulatory Visit (INDEPENDENT_AMBULATORY_CARE_PROVIDER_SITE_OTHER): Payer: Medicare Other | Admitting: Neurology

## 2014-07-18 VITALS — BP 139/80 | HR 81 | Temp 97.1°F | Ht 71.0 in | Wt 172.0 lb

## 2014-07-18 DIAGNOSIS — G9340 Encephalopathy, unspecified: Secondary | ICD-10-CM

## 2014-07-18 DIAGNOSIS — R479 Unspecified speech disturbances: Secondary | ICD-10-CM

## 2014-07-18 DIAGNOSIS — R413 Other amnesia: Secondary | ICD-10-CM

## 2014-07-18 DIAGNOSIS — G608 Other hereditary and idiopathic neuropathies: Secondary | ICD-10-CM

## 2014-07-18 DIAGNOSIS — G20 Parkinson's disease: Secondary | ICD-10-CM

## 2014-07-18 DIAGNOSIS — G231 Progressive supranuclear ophthalmoplegia [Steele-Richardson-Olszewski]: Secondary | ICD-10-CM
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
## 2014-07-18 NOTE — Patient Instructions (Signed)
We could pursue a so called DaT scan. Look over the information and discuss with Hessie KnowsOtis, his son. If you want to pursue the scan, we can refer [facility].  Remember, it may or may not change our management.  He needs supervision 24/7. I am not 100% sure about the diagnosis. He may have a neurodegenerative condition, but also has several vascular risk factors. Work up so far was largely non revealing.  I will see him back in

## 2014-07-31 ENCOUNTER — Telehealth: Payer: Self-pay | Admitting: Family Medicine

## 2014-07-31 MED ORDER — HYDROCODONE-ACETAMINOPHEN 7.5-325 MG PO TABS: 1.0000 | ORAL_TABLET | Freq: Four times a day (QID) | ORAL | Status: DC | PRN

## 2014-07-31 NOTE — Telephone Encounter (Signed)
Approved For a one-month supply with 0 additional refills

## 2014-07-31 NOTE — Telephone Encounter (Signed)
?   OK to Refill  - Last refill 07/02/14

## 2014-07-31 NOTE — Telephone Encounter (Signed)
[phone]  Pt is needing a refill on HYDROcodone-acetaminophen (NORCO) 7.5-325 MG per tablet

## 2014-07-31 NOTE — Telephone Encounter (Signed)
Rx printed and signed.  

## 2014-08-01 NOTE — Telephone Encounter (Signed)
Pt aware to p/u 

## 2014-08-12 ENCOUNTER — Other Ambulatory Visit: Payer: Self-pay | Admitting: Family Medicine

## 2014-08-12 ENCOUNTER — Telehealth: Payer: Self-pay | Admitting: *Deleted

## 2014-08-12 DIAGNOSIS — Z1211 Encounter for screening for malignant neoplasm of colon: Secondary | ICD-10-CM

## 2014-08-12 NOTE — Telephone Encounter (Signed)
Colonoscopy placed for [facility], pending appt

## 2014-08-12 NOTE — Telephone Encounter (Signed)
-----   Message from Lysbeth GalasShannon L James sent at 08/05/2014 11:53 AM EST ----- Regarding: [employer]/Colonoscopy Can you please try to get patient scheduled before end of year for colonoscopy or if you can find results I will take it  Thanks Carollee HerterShannon

## 2014-08-16 ENCOUNTER — Other Ambulatory Visit: Payer: Self-pay | Admitting: Family Medicine

## 2014-08-26 ENCOUNTER — Encounter: Payer: Self-pay | Admitting: *Deleted

## 2014-08-26 ENCOUNTER — Telehealth: Payer: Self-pay | Admitting: Neurology

## 2014-08-26 NOTE — Telephone Encounter (Signed)
Per Dr Frances FurbishAthar, form should be completed by pcp,[facility]

## 2014-08-26 NOTE — Telephone Encounter (Signed)
Received form from [facility] on 08/26/14,left message with Lawanna Kobusngel for more information

## 2014-08-26 NOTE — Telephone Encounter (Signed)
Called Lawanna Kobusngel to get name of facility for completion of form requested

## 2014-08-27 ENCOUNTER — Telehealth: Payer: Self-pay | Admitting: Family Medicine

## 2014-08-27 ENCOUNTER — Telehealth: Payer: Self-pay | Admitting: Neurology

## 2014-08-27 NOTE — Telephone Encounter (Signed)
Spoke with Jonathan Wells and per Dr Frances FurbishAthar , form should be completed by PCP. She verbalized understanding and said that she will come and pick up form from our office(front desk).

## 2014-08-27 NOTE — Telephone Encounter (Signed)
[phone] ° °Pt is needing a refill on HYDROcodone-acetaminophen (NORCO) 7.5-325 MG per tablet °

## 2014-08-27 NOTE — Telephone Encounter (Signed)
LRF 11/19  #120  LOV 10/20   OK refill?

## 2014-08-27 NOTE — Telephone Encounter (Signed)
Form is at the front desk

## 2014-08-27 NOTE — Telephone Encounter (Signed)
Wilson SingerAngel Ervin returning call regarding form.  Please return call.

## 2014-08-27 NOTE — Telephone Encounter (Signed)
Called and left a voicemail message with Jonathan Wells for return call back concerning form that was submitted

## 2014-08-28 MED ORDER — HYDROCODONE-ACETAMINOPHEN 7.5-325 MG PO TABS: 1.0000 | ORAL_TABLET | Freq: Four times a day (QID) | ORAL | Status: AC | PRN

## 2014-08-28 NOTE — Telephone Encounter (Signed)
Rx printed for provider signature and patient made aware ready for pick up.

## 2014-08-28 NOTE — Telephone Encounter (Signed)
ok 

## 2014-09-08 ENCOUNTER — Telehealth: Payer: Self-pay | Admitting: Family Medicine

## 2014-09-08 ENCOUNTER — Encounter: Payer: Self-pay | Admitting: *Deleted

## 2014-09-08 MED ORDER — QUETIAPINE FUMARATE 25 MG PO TABS: ORAL_TABLET | ORAL | Status: AC

## 2014-09-08 NOTE — Telephone Encounter (Signed)
[phone] PT is needing a refill QUEtiapine (SEROQUEL) 25 MG tablet [facility]

## 2014-09-08 NOTE — Telephone Encounter (Signed)
Med sent to pharm 

## 2014-09-12 DIAGNOSIS — M6281 Muscle weakness (generalized): Secondary | ICD-10-CM | POA: Diagnosis not present

## 2014-09-12 DIAGNOSIS — F0390 Unspecified dementia without behavioral disturbance: Secondary | ICD-10-CM | POA: Diagnosis not present

## 2014-09-12 DIAGNOSIS — R41841 Cognitive communication deficit: Secondary | ICD-10-CM | POA: Diagnosis not present

## 2014-09-12 DIAGNOSIS — R2689 Other abnormalities of gait and mobility: Secondary | ICD-10-CM | POA: Diagnosis not present

## 2014-09-13 DIAGNOSIS — R41841 Cognitive communication deficit: Secondary | ICD-10-CM | POA: Diagnosis not present

## 2014-09-13 DIAGNOSIS — R2689 Other abnormalities of gait and mobility: Secondary | ICD-10-CM | POA: Diagnosis not present

## 2014-09-13 DIAGNOSIS — M6281 Muscle weakness (generalized): Secondary | ICD-10-CM | POA: Diagnosis not present

## 2014-09-13 DIAGNOSIS — F0390 Unspecified dementia without behavioral disturbance: Secondary | ICD-10-CM | POA: Diagnosis not present

## 2014-09-15 DIAGNOSIS — R2689 Other abnormalities of gait and mobility: Secondary | ICD-10-CM | POA: Diagnosis not present

## 2014-09-15 DIAGNOSIS — M6281 Muscle weakness (generalized): Secondary | ICD-10-CM | POA: Diagnosis not present

## 2014-09-15 DIAGNOSIS — F0390 Unspecified dementia without behavioral disturbance: Secondary | ICD-10-CM | POA: Diagnosis not present

## 2014-09-15 DIAGNOSIS — R41841 Cognitive communication deficit: Secondary | ICD-10-CM | POA: Diagnosis not present

## 2014-09-16 DIAGNOSIS — M6281 Muscle weakness (generalized): Secondary | ICD-10-CM | POA: Diagnosis not present

## 2014-09-16 DIAGNOSIS — R41841 Cognitive communication deficit: Secondary | ICD-10-CM | POA: Diagnosis not present

## 2014-09-16 DIAGNOSIS — R2689 Other abnormalities of gait and mobility: Secondary | ICD-10-CM | POA: Diagnosis not present

## 2014-09-16 DIAGNOSIS — F0390 Unspecified dementia without behavioral disturbance: Secondary | ICD-10-CM | POA: Diagnosis not present

## 2014-09-17 DIAGNOSIS — R41841 Cognitive communication deficit: Secondary | ICD-10-CM | POA: Diagnosis not present

## 2014-09-17 DIAGNOSIS — M6281 Muscle weakness (generalized): Secondary | ICD-10-CM | POA: Diagnosis not present

## 2014-09-17 DIAGNOSIS — R2689 Other abnormalities of gait and mobility: Secondary | ICD-10-CM | POA: Diagnosis not present

## 2014-09-17 DIAGNOSIS — F0390 Unspecified dementia without behavioral disturbance: Secondary | ICD-10-CM | POA: Diagnosis not present

## 2014-09-18 DIAGNOSIS — F0390 Unspecified dementia without behavioral disturbance: Secondary | ICD-10-CM | POA: Diagnosis not present

## 2014-09-18 DIAGNOSIS — R2689 Other abnormalities of gait and mobility: Secondary | ICD-10-CM | POA: Diagnosis not present

## 2014-09-18 DIAGNOSIS — M6281 Muscle weakness (generalized): Secondary | ICD-10-CM | POA: Diagnosis not present

## 2014-09-18 DIAGNOSIS — R41841 Cognitive communication deficit: Secondary | ICD-10-CM | POA: Diagnosis not present

## 2014-09-19 DIAGNOSIS — R41841 Cognitive communication deficit: Secondary | ICD-10-CM | POA: Diagnosis not present

## 2014-09-19 DIAGNOSIS — R2689 Other abnormalities of gait and mobility: Secondary | ICD-10-CM | POA: Diagnosis not present

## 2014-09-19 DIAGNOSIS — F0390 Unspecified dementia without behavioral disturbance: Secondary | ICD-10-CM | POA: Diagnosis not present

## 2014-09-19 DIAGNOSIS — M6281 Muscle weakness (generalized): Secondary | ICD-10-CM | POA: Diagnosis not present

## 2014-09-22 DIAGNOSIS — R41841 Cognitive communication deficit: Secondary | ICD-10-CM | POA: Diagnosis not present

## 2014-09-22 DIAGNOSIS — R2689 Other abnormalities of gait and mobility: Secondary | ICD-10-CM | POA: Diagnosis not present

## 2014-09-22 DIAGNOSIS — M6281 Muscle weakness (generalized): Secondary | ICD-10-CM | POA: Diagnosis not present

## 2014-09-22 DIAGNOSIS — F0390 Unspecified dementia without behavioral disturbance: Secondary | ICD-10-CM | POA: Diagnosis not present

## 2014-09-23 DIAGNOSIS — M6281 Muscle weakness (generalized): Secondary | ICD-10-CM | POA: Diagnosis not present

## 2014-09-23 DIAGNOSIS — R41841 Cognitive communication deficit: Secondary | ICD-10-CM | POA: Diagnosis not present

## 2014-09-23 DIAGNOSIS — F0390 Unspecified dementia without behavioral disturbance: Secondary | ICD-10-CM | POA: Diagnosis not present

## 2014-09-23 DIAGNOSIS — R2689 Other abnormalities of gait and mobility: Secondary | ICD-10-CM | POA: Diagnosis not present

## 2014-09-24 DIAGNOSIS — R41841 Cognitive communication deficit: Secondary | ICD-10-CM | POA: Diagnosis not present

## 2014-09-24 DIAGNOSIS — R2689 Other abnormalities of gait and mobility: Secondary | ICD-10-CM | POA: Diagnosis not present

## 2014-09-24 DIAGNOSIS — F0390 Unspecified dementia without behavioral disturbance: Secondary | ICD-10-CM | POA: Diagnosis not present

## 2014-09-24 DIAGNOSIS — M6281 Muscle weakness (generalized): Secondary | ICD-10-CM | POA: Diagnosis not present

## 2014-09-25 DIAGNOSIS — M6281 Muscle weakness (generalized): Secondary | ICD-10-CM | POA: Diagnosis not present

## 2014-09-25 DIAGNOSIS — R2689 Other abnormalities of gait and mobility: Secondary | ICD-10-CM | POA: Diagnosis not present

## 2014-09-25 DIAGNOSIS — R41841 Cognitive communication deficit: Secondary | ICD-10-CM | POA: Diagnosis not present

## 2014-09-25 DIAGNOSIS — F0390 Unspecified dementia without behavioral disturbance: Secondary | ICD-10-CM | POA: Diagnosis not present

## 2014-09-26 DIAGNOSIS — R41841 Cognitive communication deficit: Secondary | ICD-10-CM | POA: Diagnosis not present

## 2014-09-26 DIAGNOSIS — R2689 Other abnormalities of gait and mobility: Secondary | ICD-10-CM | POA: Diagnosis not present

## 2014-09-26 DIAGNOSIS — M6281 Muscle weakness (generalized): Secondary | ICD-10-CM | POA: Diagnosis not present

## 2014-09-26 DIAGNOSIS — F0390 Unspecified dementia without behavioral disturbance: Secondary | ICD-10-CM | POA: Diagnosis not present

## 2014-09-27 DIAGNOSIS — R41841 Cognitive communication deficit: Secondary | ICD-10-CM | POA: Diagnosis not present

## 2014-09-27 DIAGNOSIS — F0390 Unspecified dementia without behavioral disturbance: Secondary | ICD-10-CM | POA: Diagnosis not present

## 2014-09-27 DIAGNOSIS — R2689 Other abnormalities of gait and mobility: Secondary | ICD-10-CM | POA: Diagnosis not present

## 2014-09-27 DIAGNOSIS — M6281 Muscle weakness (generalized): Secondary | ICD-10-CM | POA: Diagnosis not present

## 2014-09-29 DIAGNOSIS — R41841 Cognitive communication deficit: Secondary | ICD-10-CM | POA: Diagnosis not present

## 2014-09-29 DIAGNOSIS — R2689 Other abnormalities of gait and mobility: Secondary | ICD-10-CM | POA: Diagnosis not present

## 2014-09-29 DIAGNOSIS — F0390 Unspecified dementia without behavioral disturbance: Secondary | ICD-10-CM | POA: Diagnosis not present

## 2014-09-29 DIAGNOSIS — M6281 Muscle weakness (generalized): Secondary | ICD-10-CM | POA: Diagnosis not present

## 2014-09-30 DIAGNOSIS — R2689 Other abnormalities of gait and mobility: Secondary | ICD-10-CM | POA: Diagnosis not present

## 2014-09-30 DIAGNOSIS — M6281 Muscle weakness (generalized): Secondary | ICD-10-CM | POA: Diagnosis not present

## 2014-09-30 DIAGNOSIS — R41841 Cognitive communication deficit: Secondary | ICD-10-CM | POA: Diagnosis not present

## 2014-09-30 DIAGNOSIS — F0390 Unspecified dementia without behavioral disturbance: Secondary | ICD-10-CM | POA: Diagnosis not present

## 2014-10-01 DIAGNOSIS — R2689 Other abnormalities of gait and mobility: Secondary | ICD-10-CM | POA: Diagnosis not present

## 2014-10-01 DIAGNOSIS — R41841 Cognitive communication deficit: Secondary | ICD-10-CM | POA: Diagnosis not present

## 2014-10-01 DIAGNOSIS — F0390 Unspecified dementia without behavioral disturbance: Secondary | ICD-10-CM | POA: Diagnosis not present

## 2014-10-01 DIAGNOSIS — M6281 Muscle weakness (generalized): Secondary | ICD-10-CM | POA: Diagnosis not present

## 2014-10-02 DIAGNOSIS — F0390 Unspecified dementia without behavioral disturbance: Secondary | ICD-10-CM | POA: Diagnosis not present

## 2014-10-02 DIAGNOSIS — M6281 Muscle weakness (generalized): Secondary | ICD-10-CM | POA: Diagnosis not present

## 2014-10-02 DIAGNOSIS — R2689 Other abnormalities of gait and mobility: Secondary | ICD-10-CM | POA: Diagnosis not present

## 2014-10-02 DIAGNOSIS — R41841 Cognitive communication deficit: Secondary | ICD-10-CM | POA: Diagnosis not present

## 2014-10-03 DIAGNOSIS — F0390 Unspecified dementia without behavioral disturbance: Secondary | ICD-10-CM | POA: Diagnosis not present

## 2014-10-03 DIAGNOSIS — R2689 Other abnormalities of gait and mobility: Secondary | ICD-10-CM | POA: Diagnosis not present

## 2014-10-03 DIAGNOSIS — R41841 Cognitive communication deficit: Secondary | ICD-10-CM | POA: Diagnosis not present

## 2014-10-03 DIAGNOSIS — M6281 Muscle weakness (generalized): Secondary | ICD-10-CM | POA: Diagnosis not present

## 2014-10-06 DIAGNOSIS — F0390 Unspecified dementia without behavioral disturbance: Secondary | ICD-10-CM | POA: Diagnosis not present

## 2014-10-06 DIAGNOSIS — R2689 Other abnormalities of gait and mobility: Secondary | ICD-10-CM | POA: Diagnosis not present

## 2014-10-06 DIAGNOSIS — R41841 Cognitive communication deficit: Secondary | ICD-10-CM | POA: Diagnosis not present

## 2014-10-06 DIAGNOSIS — M6281 Muscle weakness (generalized): Secondary | ICD-10-CM | POA: Diagnosis not present

## 2014-10-07 DIAGNOSIS — E119 Type 2 diabetes mellitus without complications: Secondary | ICD-10-CM | POA: Diagnosis not present

## 2014-10-07 DIAGNOSIS — R2689 Other abnormalities of gait and mobility: Secondary | ICD-10-CM | POA: Diagnosis not present

## 2014-10-07 DIAGNOSIS — F0390 Unspecified dementia without behavioral disturbance: Secondary | ICD-10-CM | POA: Diagnosis not present

## 2014-10-07 DIAGNOSIS — F0280 Dementia in other diseases classified elsewhere without behavioral disturbance: Secondary | ICD-10-CM | POA: Diagnosis not present

## 2014-10-07 DIAGNOSIS — M6281 Muscle weakness (generalized): Secondary | ICD-10-CM | POA: Diagnosis not present

## 2014-10-07 DIAGNOSIS — I10 Essential (primary) hypertension: Secondary | ICD-10-CM | POA: Diagnosis not present

## 2014-10-07 DIAGNOSIS — R41841 Cognitive communication deficit: Secondary | ICD-10-CM | POA: Diagnosis not present

## 2014-10-07 DIAGNOSIS — J449 Chronic obstructive pulmonary disease, unspecified: Secondary | ICD-10-CM | POA: Diagnosis not present

## 2014-11-17 ENCOUNTER — Encounter: Payer: Self-pay | Admitting: Neurology

## 2014-11-17 ENCOUNTER — Ambulatory Visit: Payer: Medicare Other | Admitting: Neurology

## 2014-11-18 ENCOUNTER — Encounter: Payer: Self-pay | Admitting: Neurology

## 2014-11-18 ENCOUNTER — Encounter: Payer: Self-pay | Admitting: *Deleted
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
## 2016-03-04 ENCOUNTER — Emergency Department (HOSPITAL_COMMUNITY)
Admission: EM | Admit: 2016-03-04 | Discharge: 2016-03-04 | Disposition: A | Discharge status: Home / Self Care | Payer: Medicare Other | Attending: Emergency Medicine | Admitting: Emergency Medicine

## 2016-03-04 ENCOUNTER — Encounter (HOSPITAL_COMMUNITY): Payer: Self-pay | Admitting: Emergency Medicine

## 2016-03-04 DIAGNOSIS — J449 Chronic obstructive pulmonary disease, unspecified: Secondary | ICD-10-CM | POA: Diagnosis not present

## 2016-03-04 DIAGNOSIS — Z7982 Long term (current) use of aspirin: Secondary | ICD-10-CM | POA: Diagnosis not present

## 2016-03-04 DIAGNOSIS — Z87891 Personal history of nicotine dependence: Secondary | ICD-10-CM | POA: Diagnosis not present

## 2016-03-04 DIAGNOSIS — Z8673 Personal history of transient ischemic attack (TIA), and cerebral infarction without residual deficits: Secondary | ICD-10-CM | POA: Insufficient documentation

## 2016-03-04 DIAGNOSIS — W19XXXA Unspecified fall, initial encounter: Secondary | ICD-10-CM | POA: Diagnosis not present

## 2016-03-04 DIAGNOSIS — E119 Type 2 diabetes mellitus without complications: Secondary | ICD-10-CM | POA: Diagnosis not present

## 2016-03-04 DIAGNOSIS — Z79891 Long term (current) use of opiate analgesic: Secondary | ICD-10-CM | POA: Diagnosis not present

## 2016-03-04 DIAGNOSIS — S0101XA Laceration without foreign body of scalp, initial encounter: Secondary | ICD-10-CM | POA: Insufficient documentation

## 2016-03-04 DIAGNOSIS — E669 Obesity, unspecified: Secondary | ICD-10-CM | POA: Insufficient documentation

## 2016-03-04 DIAGNOSIS — Y999 Unspecified external cause status: Secondary | ICD-10-CM | POA: Diagnosis not present

## 2016-03-04 DIAGNOSIS — Y9301 Activity, walking, marching and hiking: Secondary | ICD-10-CM | POA: Insufficient documentation

## 2016-03-04 DIAGNOSIS — E785 Hyperlipidemia, unspecified: Secondary | ICD-10-CM | POA: Diagnosis not present

## 2016-03-04 DIAGNOSIS — Y9289 Other specified places as the place of occurrence of the external cause: Secondary | ICD-10-CM | POA: Diagnosis not present

## 2016-03-04 DIAGNOSIS — Z79899 Other long term (current) drug therapy: Secondary | ICD-10-CM | POA: Diagnosis not present

## 2016-03-04 DIAGNOSIS — Z7984 Long term (current) use of oral hypoglycemic drugs: Secondary | ICD-10-CM | POA: Diagnosis not present

## 2016-03-04 HISTORY — DX: Lymphocytosis (symptomatic): D72.820

## 2016-03-04 HISTORY — DX: Anxiety disorder, unspecified: F41.9

## 2016-03-04 HISTORY — DX: Trichotillomania: F63.3

## 2016-03-04 HISTORY — DX: Dysphagia, unspecified: R13.10

## 2016-03-04 NOTE — ED Notes (Signed)
Called [facility] for transport back [facility].  Dispatcher says," it may be awhile, there are a couple of transports ahead on him".

## 2016-03-04 NOTE — ED Notes (Signed)
[facility] with no answer.  Transported with verbal and written instructions to EMS>

## 2016-03-04 NOTE — ED Notes (Signed)
Pt sleeping on his side.

## 2016-03-04 NOTE — ED Notes (Signed)
Patient with un-witnessed fall at [facility] this morning. Staff states he was found walking down the hallway bleeding, and stated he fell. Per staff, he is at baseline mental status.

## 2016-03-04 NOTE — ED Notes (Signed)
Awaiting [facility] for transport back [facility].  Pt continues to lay on side.  Has been nonverbal since arrival.  Continues to be combative if disturbed.

## 2016-03-04 NOTE — ED Notes (Signed)
EMS here to transfer Pt back [facility].

## 2016-03-04 NOTE — ED Provider Notes (Addendum)
CSN: [id number]     Arrival date & time 03/04/16  1031 History   First MD Initiated Contact with Patient 03/04/16 1158     Chief Complaint  Patient presents with  . Fall     (Consider location/radiation/quality/duration/timing/severity/associated sxs/prior Treatment) HPI   75 year old male presenting for evaluation after a fall. Unwitnessed. He is coming from a nursing facility. Apparently he was found by staff walking down the hall and noted to be bleeding. Unclear the exact circumstances. Per report, he is at his baseline mental status. He does have a past history of advanced dementia. He will make eye contact but he is not speaking with me or following commands. Moving spontaneously though no obvious focal motor deficits. No blood thinners per medication list.   Past Medical History  Diagnosis Date  . Stroke ([place]) 1996  . Hyperlipidemia   . Hypertension   . COPD (chronic obstructive pulmonary disease) ([place])   . BPH (benign prostatic hypertrophy)   . PVD (peripheral vascular disease) ([place])   . Claudication ([place])   . Obesity   . Diabetes mellitus   . Bacteremia   . H/O bladder infections   . Chicken pox   . Measles   . Mumps   . Peripheral artery disease ([place])   . Dementia     possible supranuclear palsy  . Trichotillomania   . Dysphagia   . Lymphocytosis   . Anxiety    Past Surgical History  Procedure Laterality Date  . Hernia repair  2005  . Appendectomy  1958  . Rotator cup repair      Left  . Spleen removal  1962  . Carpal tunnel release  1999  . Back surgery  1979    lumbar  . Aorto-bifemoral bypass     Family History  Problem Relation Age of Onset  . Heart disease Father     MI  . Cancer Sister    Social History  Substance Use Topics  . Smoking status: Former Smoker    Quit date: 09/13/1995  . Smokeless tobacco: None  . Alcohol Use: 0.0 oz/week    0 Standard drinks or equivalent per week    Review of Systems  Level V caveat because of  dementia.  Allergies  Lipitor  Home Medications   Prior to Admission medications   Medication Sig Start Date End Date Taking? Authorizing Provider  acetaminophen (TYLENOL) 650 MG CR tablet Take 650 mg by mouth every 4 (four) hours as needed for pain.   Yes Historical Provider, MD  aspirin 81 MG tablet Take 81 mg by mouth daily.   Yes Historical Provider, MD  feeding supplement (BOOST / RESOURCE BREEZE) LIQD Take 1 Container by mouth 3 (three) times daily. With med pass.   Yes Historical Provider, MD  lisinopril (PRINIVIL,ZESTRIL) 2.5 MG tablet Take 2.5 mg by mouth daily.   Yes Historical Provider, MD  LORazepam (ATIVAN) 0.5 MG tablet Take 0.5 tablets by mouth 2 (two) times daily. 02/25/16  Yes Historical Provider, MD  sertraline (ZOLOFT) 100 MG tablet Take 1 tablet by mouth daily. 02/11/16  Yes Historical Provider, MD  simvastatin (ZOCOR) 40 MG tablet Take 40 mg by mouth daily.   Yes Historical Provider, MD  Vitamin D, Ergocalciferol, (DRISDOL) 50000 units CAPS capsule Take 50,000 Units by mouth every 30 (thirty) days. On the 13th of each month.   Yes Historical Provider, MD  ACCU-CHEK SOFTCLIX LANCETS lancets Use as instructed 02/21/14   Susy Frizzle, MD  Blood  Glucose Monitoring Suppl (ACCU-CHEK AVIVA PLUS) W/DEVICE KIT Use as directed 02/21/14   Susy Frizzle, MD  fenofibrate micronized (LOFIBRA) 134 MG capsule TAKE ONE CAPSULE BY MOUTH EVERY DAY Patient not taking: Reported on 03/04/2016 02/21/14   Susy Frizzle, MD  fluticasone [facility]) 50 MCG/ACT nasal spray Place 2 sprays into both nostrils daily. Patient not taking: Reported on 03/04/2016 03/31/14   Susy Frizzle, MD  glucose blood (ACCU-CHEK AVIVA PLUS) test strip Checks BS bid - DX - 250.00 02/21/14   Susy Frizzle, MD  HYDROcodone-acetaminophen (NORCO) 7.5-325 MG per tablet Take 1 tablet by mouth every 6 (six) hours as needed for moderate pain. Patient not taking: Reported on 03/04/2016 08/28/14   Susy Frizzle, MD   metFORMIN (GLUCOPHAGE) 1000 MG tablet TAKE 1 TABLET BY MOUTH TWICE A DAY Patient not taking: Reported on 03/04/2016 02/21/14   Susy Frizzle, MD  QUEtiapine (SEROQUEL) 25 MG tablet TAKE 1 TABLET (25 MG TOTAL) BY MOUTH AT BEDTIME. Patient not taking: Reported on 03/04/2016 09/08/14   Susy Frizzle, MD   BP 103/57 mmHg  Pulse 106  Temp(Src) 97.3 F (36.3 C) (Axillary)  Resp 16  SpO2 99% Physical Exam  Constitutional: He appears well-developed and well-nourished. No distress.  Laying of the left side. No acute distress.  HENT:  Head: Normocephalic.    3 cm laceration to the posterior scalp. No active bleeding. No significant hematoma.  Eyes: Conjunctivae are normal. Pupils are equal, round, and reactive to light. Right eye exhibits no discharge. Left eye exhibits no discharge.  Neck: Neck supple.  Cardiovascular: Normal rate, regular rhythm and normal heart sounds.  Exam reveals no gallop and no friction rub.   No murmur heard. Pulmonary/Chest: Effort normal and breath sounds normal. No respiratory distress.  Abdominal: Soft. He exhibits no distension. There is no tenderness.  Musculoskeletal: He exhibits no edema or tenderness.  Does not seem to react to pain with palpation of his spine. Subacute appearing ecchymosis and abrasions to bilateral elbows. No appreciable bony tenderness of extremities. No apparent pain with range of motion of the large joints.  Neurological: He is alert.  Laying in bed with eyes open. Will make eye contact briefly. Nonverbal for me. Not following commands. He does reposition himself extremities are moved. He does try to push away when trying to examine his scalp wound. The strength seems good and no focal motor deficits noted. No facial droop. Difficult to get additional neuro exam secondary to following commands.  Skin: Skin is warm and dry.  Nursing note and vitals reviewed.   ED Course  Procedures (including critical care time)  LACERATION  REPAIR Performed by: Virgel Manifold Authorized by: Virgel Manifold Consent: Verbal consent obtained. Risks and benefits: risks, benefits and alternatives were discussed Consent given by: patient Patient identity confirmed: provided demographic data Prepped and Draped in normal sterile fashion Wound explored  Laceration Location: posterior scalp  Laceration Length: 3 cm  No Foreign Bodies seen or palpated  Anesthesia: none  Local anesthetic: none  Anesthetic total: n/a Irrigation method: syringe Amount of cleaning: standard  Skin closure: single layer  Number of sutures: 3  Technique: stapled  Patient tolerance: Patient tolerated the procedure well with no immediate complications.  Labs Review Labs Reviewed - No data to display  Imaging Review No results found. I have personally reviewed and evaluated these images and lab results as part of my medical decision-making.   EKG Interpretation None  MDM   Final diagnoses:  Scalp laceration, initial encounter    75 year old male presenting after fall. Scalp laceration which was repaired with staples. This reportedly has baseline mental status. There are no obvious focal deficits. No blood thinners. No other signs of acute trauma aside from a scalp laceration. Afebrile. HD stable. With patient at his baseline mental status and no obvious acute deficits and no blood thinners, imaging was deferred. My suspicion for significant intracranial injury which may need intervention is very low. Wound care. outpt FU as needed otherwise and for staple removal.     Virgel Manifold, MD 03/04/16 0148  Virgel Manifold, MD 03/04/16 1240

## 2016-03-04 NOTE — Discharge Instructions (Signed)
Head Injury, Adult °You have a head injury. Headaches and throwing up (vomiting) are common after a head injury. It should be easy to wake up from sleeping. Sometimes you must stay in the hospital. Most problems happen within the first 24 hours. Side effects may occur up to 7-10 days after the injury.  °WHAT ARE THE TYPES OF HEAD INJURIES? °Head injuries can be as minor as a bump. Some head injuries can be more severe. More severe head injuries include: °· A jarring injury to the brain (concussion). °· A bruise of the brain (contusion). This mean there is bleeding in the brain that can cause swelling. °· A cracked skull (skull fracture). °· Bleeding in the brain that collects, clots, and forms a bump (hematoma). °WHEN SHOULD I GET HELP RIGHT AWAY?  °· You are confused or sleepy. °· You cannot be woken up. °· You feel sick to your stomach (nauseous) or keep throwing up (vomiting). °· Your dizziness or unsteadiness is getting worse. °· You have very bad, lasting headaches that are not helped by medicine. Take medicines only as told by your doctor. °· You cannot use your arms or legs like normal. °· You cannot walk. °· You notice changes in the black spots in the center of the colored part of your eye (pupil). °· You have clear or bloody fluid coming from your nose or ears. °· You have trouble seeing. °During the next 24 hours after the injury, you must stay with someone who can watch you. This person should get help right away (call 911 in the [place] if you start to shake and are not able to control it (have seizures), you pass out, or you are unable to wake up. °HOW CAN I PREVENT A HEAD INJURY IN THE FUTURE? °· Wear seat belts. °· Wear a helmet while bike riding and playing sports like football. °· Stay away from dangerous activities around the house. °WHEN CAN I RETURN TO NORMAL ACTIVITIES AND ATHLETICS? °See your doctor before doing these activities. You should not do normal activities or play contact sports until 1  week after the following symptoms have stopped: °· Headache that does not go away. °· Dizziness. °· Poor attention. °· Confusion. °· Memory problems. °· Sickness to your stomach or throwing up. °· Tiredness. °· Fussiness. °· Bothered by bright lights or loud noises. °· Anxiousness or depression. °· Restless sleep. °MAKE SURE YOU:  °· Understand these instructions. °· Will watch your condition. °· Will get help right away if you are not doing well or get worse. °  °This information is not intended to replace advice given to you by your health care provider. Make sure you discuss any questions you have with your health care provider. °  °Document Released: 08/11/2008 Document Revised: 09/19/2014 Document Reviewed: 05/06/2013 °Elsevier Interactive Patient Education ©2016 [employer] ° °Laceration Care, Adult °A laceration is a cut that goes through all layers of the skin. The cut also goes into the tissue that is right under the skin. Some cuts heal on their own. Others need to be closed with stitches (sutures), staples, skin adhesive strips, or wound glue. Taking care of your cut lowers your risk of infection and helps your cut to heal better. °HOW TO TAKE CARE OF YOUR CUT °For stitches or staples: °· Keep the wound clean and dry. °· If you were given a bandage (dressing), you should change it at least one time per day or as told by your doctor. You should also   change it if it gets wet or dirty. °· Keep the wound completely dry for the first 24 hours or as told by your doctor. After that time, you may take a shower or a bath. However, make sure that the wound is not soaked in water until after the stitches or staples have been removed. °· Clean the wound one time each day or as told by your doctor: °¨ Wash the wound with soap and water. °¨ Rinse the wound with water until all of the soap comes off. °¨ Pat the wound dry with a clean towel. Do not rub the wound. °· After you clean the wound, put a thin layer of  antibiotic ointment on it as told by your doctor. This ointment: °¨ Helps to prevent infection. °¨ Keeps the bandage from sticking to the wound. °· Have your stitches or staples removed as told by your doctor. °If your doctor used skin adhesive strips:  °· Keep the wound clean and dry. °· If you were given a bandage, you should change it at least one time per day or as told by your doctor. You should also change it if it gets dirty or wet. °· Do not get the skin adhesive strips wet. You can take a shower or a bath, but be careful to keep the wound dry. °· If the wound gets wet, pat it dry with a clean towel. Do not rub the wound. °· Skin adhesive strips fall off on their own. You can trim the strips as the wound heals. Do not remove any strips that are still stuck to the wound. They will fall off after a while. °If your doctor used wound glue: °· Try to keep your wound dry, but you may briefly wet it in the shower or bath. Do not soak the wound in water, such as by swimming. °· After you take a shower or a bath, gently pat the wound dry with a clean towel. Do not rub the wound. °· Do not do any activities that will make you really sweaty until the skin glue has fallen off on its own. °· Do not apply liquid, cream, or ointment medicine to your wound while the skin glue is still on. °· If you were given a bandage, you should change it at least one time per day or as told by your doctor. You should also change it if it gets dirty or wet. °· If a bandage is placed over the wound, do not let the tape for the bandage touch the skin glue. °· Do not pick at the glue. The skin glue usually stays on for 5-10 days. Then, it falls off of the skin. °General Instructions  °· To help prevent scarring, make sure to cover your wound with sunscreen whenever you are outside after stitches are removed, after adhesive strips are removed, or when wound glue stays in place and the wound is healed. Make sure to wear a sunscreen of at least  30 SPF. °· Take over-the-counter and prescription medicines only as told by your doctor. °· If you were given antibiotic medicine or ointment, take or apply it as told by your doctor. Do not stop using the antibiotic even if your wound is getting better. °· Do not scratch or pick at the wound. °· Keep all follow-up visits as told by your doctor. This is important. °· Check your wound every day for signs of infection. Watch for: °¨ Redness, swelling, or pain. °¨ Fluid, blood, or pus. °·   Raise (elevate) the injured area above the level of your heart while you are sitting or lying down, if possible. °GET HELP IF: °· You got a tetanus shot and you have any of these problems at the injection site: °¨ Swelling. °¨ Very bad pain. °¨ Redness. °¨ Bleeding. °· You have a fever. °· A wound that was closed breaks open. °· You notice a bad smell coming from your wound or your bandage. °· You notice something coming out of the wound, such as wood or glass. °· Medicine does not help your pain. °· You have more redness, swelling, or pain at the site of your wound. °· You have fluid, blood, or pus coming from your wound. °· You notice a change in the color of your skin near your wound. °· You need to change the bandage often because fluid, blood, or pus is coming from the wound. °· You start to have a new rash. °· You start to have numbness around the wound. °GET HELP RIGHT AWAY IF: °· You have very bad swelling around the wound. °· Your pain suddenly gets worse and is very bad. °· You notice painful lumps near the wound or on skin that is anywhere on your body. °· You have a red streak going away from your wound. °· The wound is on your hand or foot and you cannot move a finger or toe like you usually can. °· The wound is on your hand or foot and you notice that your fingers or toes look pale or bluish. °  °This information is not intended to replace advice given to you by your health care provider. Make sure you discuss any  questions you have with your health care provider. °  °Document Released: 02/15/2008 Document Revised: 01/13/2015 Document Reviewed: 08/25/2014 °Elsevier Interactive Patient Education ©2016 [employer] ° °

## 2016-08-12 DEATH — deceased
